# Patient Record
Sex: Female | Born: 1950 | Race: White | Hispanic: No | Marital: Married | Smoking: Former smoker
Health system: Southern US, Community
[De-identification: ages and names within clinical notes are randomized; demographics above are authoritative.]

## PROBLEM LIST (undated history)

## (undated) DIAGNOSIS — E785 Hyperlipidemia, unspecified: Secondary | ICD-10-CM

## (undated) DIAGNOSIS — C679 Malignant neoplasm of bladder, unspecified: Secondary | ICD-10-CM

## (undated) DIAGNOSIS — T7840XA Allergy, unspecified, initial encounter: Secondary | ICD-10-CM

## (undated) DIAGNOSIS — Z973 Presence of spectacles and contact lenses: Secondary | ICD-10-CM

## (undated) HISTORY — PX: TUBAL LIGATION: SHX77

## (undated) HISTORY — DX: Allergy, unspecified, initial encounter: T78.40XA

## (undated) HISTORY — PX: TONSILLECTOMY: SUR1361

---

## 2004-03-15 HISTORY — PX: COLONOSCOPY: SHX174

## 2005-12-13 ENCOUNTER — Other Ambulatory Visit: Admission: RE | Admit: 2005-12-13 | Discharge: 2005-12-13 | Payer: Self-pay | Admitting: Obstetrics and Gynecology

## 2006-07-08 ENCOUNTER — Encounter: Admission: RE | Admit: 2006-07-08 | Discharge: 2006-07-08 | Payer: Self-pay | Admitting: Internal Medicine

## 2007-09-14 ENCOUNTER — Encounter: Admission: RE | Admit: 2007-09-14 | Discharge: 2007-09-14 | Payer: Self-pay | Admitting: Obstetrics and Gynecology

## 2008-11-25 ENCOUNTER — Encounter: Admission: RE | Admit: 2008-11-25 | Discharge: 2008-11-25 | Payer: Self-pay | Admitting: Obstetrics and Gynecology

## 2009-03-04 ENCOUNTER — Emergency Department (HOSPITAL_COMMUNITY): Admission: EM | Admit: 2009-03-04 | Discharge: 2009-03-04 | Payer: Self-pay | Admitting: Family Medicine

## 2009-12-08 ENCOUNTER — Encounter: Admission: RE | Admit: 2009-12-08 | Discharge: 2009-12-08 | Payer: Self-pay | Admitting: Obstetrics and Gynecology

## 2010-11-06 ENCOUNTER — Other Ambulatory Visit: Payer: Self-pay | Admitting: Obstetrics and Gynecology

## 2010-11-06 DIAGNOSIS — Z1231 Encounter for screening mammogram for malignant neoplasm of breast: Secondary | ICD-10-CM

## 2010-12-11 ENCOUNTER — Ambulatory Visit
Admission: RE | Admit: 2010-12-11 | Discharge: 2010-12-11 | Disposition: A | Discharge status: Home / Self Care | Payer: BC Managed Care – PPO | Source: Ambulatory Visit | Attending: Obstetrics and Gynecology | Admitting: Obstetrics and Gynecology

## 2010-12-11 DIAGNOSIS — Z1231 Encounter for screening mammogram for malignant neoplasm of breast: Secondary | ICD-10-CM

## 2011-02-09 ENCOUNTER — Other Ambulatory Visit: Payer: Self-pay | Admitting: Orthopedic Surgery

## 2011-02-09 DIAGNOSIS — R531 Weakness: Secondary | ICD-10-CM

## 2011-02-09 DIAGNOSIS — M25512 Pain in left shoulder: Secondary | ICD-10-CM

## 2011-02-11 ENCOUNTER — Ambulatory Visit
Admission: RE | Admit: 2011-02-11 | Discharge: 2011-02-11 | Disposition: A | Discharge status: Home / Self Care | Payer: BC Managed Care – PPO | Source: Ambulatory Visit | Attending: Orthopedic Surgery | Admitting: Orthopedic Surgery

## 2011-02-11 DIAGNOSIS — R531 Weakness: Secondary | ICD-10-CM

## 2011-02-11 DIAGNOSIS — M25512 Pain in left shoulder: Secondary | ICD-10-CM

## 2011-04-21 ENCOUNTER — Other Ambulatory Visit: Payer: Self-pay | Admitting: Orthopedic Surgery

## 2011-04-21 ENCOUNTER — Ambulatory Visit
Admission: RE | Admit: 2011-04-21 | Discharge: 2011-04-21 | Disposition: A | Discharge status: Home / Self Care | Payer: BC Managed Care – PPO | Source: Ambulatory Visit | Attending: Orthopedic Surgery | Admitting: Orthopedic Surgery

## 2011-04-21 DIAGNOSIS — M25519 Pain in unspecified shoulder: Secondary | ICD-10-CM

## 2011-12-29 ENCOUNTER — Other Ambulatory Visit: Payer: Self-pay | Admitting: Obstetrics and Gynecology

## 2011-12-29 DIAGNOSIS — Z1231 Encounter for screening mammogram for malignant neoplasm of breast: Secondary | ICD-10-CM

## 2011-12-30 ENCOUNTER — Ambulatory Visit
Admission: RE | Admit: 2011-12-30 | Discharge: 2011-12-30 | Disposition: A | Discharge status: Home / Self Care | Payer: BC Managed Care – PPO | Source: Ambulatory Visit | Attending: Obstetrics and Gynecology | Admitting: Obstetrics and Gynecology

## 2011-12-30 DIAGNOSIS — Z1231 Encounter for screening mammogram for malignant neoplasm of breast: Secondary | ICD-10-CM

## 2013-01-08 ENCOUNTER — Other Ambulatory Visit: Payer: Self-pay | Admitting: Urology

## 2013-01-10 ENCOUNTER — Encounter (HOSPITAL_BASED_OUTPATIENT_CLINIC_OR_DEPARTMENT_OTHER): Payer: Self-pay | Admitting: *Deleted

## 2013-01-11 ENCOUNTER — Encounter (HOSPITAL_BASED_OUTPATIENT_CLINIC_OR_DEPARTMENT_OTHER): Payer: Self-pay | Admitting: *Deleted

## 2013-01-11 NOTE — Progress Notes (Signed)
NPO AFTER MN WITH EXCEPTION CLEAR LIQUIDS UNTIL 0830 (NO CREAM/ MILK PRODUCTS). ARRIVE AT 1315. NEEDS ISTAT 8.

## 2013-01-17 ENCOUNTER — Ambulatory Visit (HOSPITAL_BASED_OUTPATIENT_CLINIC_OR_DEPARTMENT_OTHER): Payer: PRIVATE HEALTH INSURANCE | Admitting: Anesthesiology

## 2013-01-17 ENCOUNTER — Ambulatory Visit (HOSPITAL_BASED_OUTPATIENT_CLINIC_OR_DEPARTMENT_OTHER)
Admission: RE | Admit: 2013-01-17 | Discharge: 2013-01-17 | Disposition: A | Discharge status: Home / Self Care | Payer: PRIVATE HEALTH INSURANCE | Source: Ambulatory Visit | Attending: Urology | Admitting: Urology

## 2013-01-17 ENCOUNTER — Encounter (HOSPITAL_BASED_OUTPATIENT_CLINIC_OR_DEPARTMENT_OTHER): Payer: Self-pay

## 2013-01-17 ENCOUNTER — Encounter (HOSPITAL_BASED_OUTPATIENT_CLINIC_OR_DEPARTMENT_OTHER): Payer: PRIVATE HEALTH INSURANCE | Admitting: Anesthesiology

## 2013-01-17 ENCOUNTER — Encounter (HOSPITAL_BASED_OUTPATIENT_CLINIC_OR_DEPARTMENT_OTHER)
Admission: RE | Disposition: A | Discharge status: Home / Self Care | Payer: Self-pay | Source: Ambulatory Visit | Attending: Urology

## 2013-01-17 ENCOUNTER — Ambulatory Visit (HOSPITAL_COMMUNITY): Payer: PRIVATE HEALTH INSURANCE

## 2013-01-17 DIAGNOSIS — E785 Hyperlipidemia, unspecified: Secondary | ICD-10-CM | POA: Insufficient documentation

## 2013-01-17 DIAGNOSIS — C679 Malignant neoplasm of bladder, unspecified: Secondary | ICD-10-CM | POA: Insufficient documentation

## 2013-01-17 DIAGNOSIS — Z87891 Personal history of nicotine dependence: Secondary | ICD-10-CM | POA: Insufficient documentation

## 2013-01-17 HISTORY — DX: Malignant neoplasm of bladder, unspecified: C67.9

## 2013-01-17 HISTORY — DX: Hyperlipidemia, unspecified: E78.5

## 2013-01-17 HISTORY — DX: Presence of spectacles and contact lenses: Z97.3

## 2013-01-17 HISTORY — PX: CYSTOSCOPY W/ URETERAL STENT PLACEMENT: SHX1429

## 2013-01-17 HISTORY — PX: TRANSURETHRAL RESECTION OF BLADDER TUMOR: SHX2575

## 2013-01-17 LAB — POCT I-STAT, CHEM 8
BUN: 12 mg/dL (ref 6–23)
Calcium, Ion: 1.17 mmol/L (ref 1.13–1.30)
Glucose, Bld: 91 mg/dL (ref 70–99)
HCT: 45 % (ref 36.0–46.0)
Hemoglobin: 15.3 g/dL — ABNORMAL HIGH (ref 12.0–15.0)
Potassium: 3.4 mEq/L — ABNORMAL LOW (ref 3.5–5.1)
Sodium: 143 mEq/L (ref 135–145)
TCO2: 22 mmol/L (ref 0–100)

## 2013-01-17 SURGERY — TURBT (TRANSURETHRAL RESECTION OF BLADDER TUMOR)
Anesthesia: General | Site: Ureter | Wound class: Clean Contaminated

## 2013-01-17 MED ORDER — OXYBUTYNIN CHLORIDE 5 MG PO TABS
5.0000 mg | ORAL_TABLET | Freq: Three times a day (TID) | ORAL | Status: DC | PRN
  Administered 2013-01-17: 5 mg via ORAL
  Filled 2013-01-17: qty 1

## 2013-01-17 MED ORDER — SENNOSIDES-DOCUSATE SODIUM 8.6-50 MG PO TABS: 1.0000 | ORAL_TABLET | Freq: Two times a day (BID) | ORAL | Status: DC

## 2013-01-17 MED ORDER — ONDANSETRON HCL 4 MG/2ML IJ SOLN
INTRAMUSCULAR | Status: DC | PRN
  Administered 2013-01-17: 4 mg via INTRAVENOUS

## 2013-01-17 MED ORDER — IOHEXOL 350 MG/ML SOLN
INTRAVENOUS | Status: DC | PRN
  Administered 2013-01-17: 10 mL

## 2013-01-17 MED ORDER — OXYCODONE HCL 5 MG/5ML PO SOLN
5.0000 mg | Freq: Once | ORAL | Status: AC | PRN
  Filled 2013-01-17: qty 5

## 2013-01-17 MED ORDER — MIDAZOLAM HCL 5 MG/5ML IJ SOLN
INTRAMUSCULAR | Status: DC | PRN
  Administered 2013-01-17: 2 mg via INTRAVENOUS

## 2013-01-17 MED ORDER — PROPOFOL 10 MG/ML IV BOLUS
INTRAVENOUS | Status: DC | PRN
  Administered 2013-01-17: 200 mg via INTRAVENOUS

## 2013-01-17 MED ORDER — OXYCODONE-ACETAMINOPHEN 5-325 MG PO TABS: 1.0000 | ORAL_TABLET | Freq: Four times a day (QID) | ORAL | Status: DC | PRN

## 2013-01-17 MED ORDER — GENTAMICIN IN SALINE 1.6-0.9 MG/ML-% IV SOLN
80.0000 mg | INTRAVENOUS | Status: DC
  Filled 2013-01-17: qty 50

## 2013-01-17 MED ORDER — SULFAMETHOXAZOLE-TMP DS 800-160 MG PO TABS: 1.0000 | ORAL_TABLET | Freq: Two times a day (BID) | ORAL | Status: DC

## 2013-01-17 MED ORDER — HYDROMORPHONE HCL PF 1 MG/ML IJ SOLN
0.2500 mg | INTRAMUSCULAR | Status: DC | PRN
  Administered 2013-01-17 (×2): 0.25 mg via INTRAVENOUS
  Filled 2013-01-17: qty 1

## 2013-01-17 MED ORDER — LIDOCAINE HCL (CARDIAC) 20 MG/ML IV SOLN
INTRAVENOUS | Status: DC | PRN
  Administered 2013-01-17: 100 mg via INTRAVENOUS

## 2013-01-17 MED ORDER — DEXAMETHASONE SODIUM PHOSPHATE 4 MG/ML IJ SOLN
INTRAMUSCULAR | Status: DC | PRN
  Administered 2013-01-17: 10 mg via INTRAVENOUS

## 2013-01-17 MED ORDER — PROMETHAZINE HCL 25 MG/ML IJ SOLN
6.2500 mg | INTRAMUSCULAR | Status: DC | PRN
  Filled 2013-01-17: qty 1

## 2013-01-17 MED ORDER — SUCCINYLCHOLINE CHLORIDE 20 MG/ML IJ SOLN
INTRAMUSCULAR | Status: DC | PRN
  Administered 2013-01-17: 80 mg via INTRAVENOUS

## 2013-01-17 MED ORDER — MEPERIDINE HCL 25 MG/ML IJ SOLN
6.2500 mg | INTRAMUSCULAR | Status: DC | PRN
  Filled 2013-01-17: qty 1

## 2013-01-17 MED ORDER — SODIUM CHLORIDE 0.9 % IR SOLN
Status: DC | PRN
  Administered 2013-01-17: 6000 mL

## 2013-01-17 MED ORDER — OXYCODONE HCL 5 MG PO TABS
5.0000 mg | ORAL_TABLET | Freq: Once | ORAL | Status: AC | PRN
  Administered 2013-01-17: 5 mg via ORAL
  Filled 2013-01-17: qty 1

## 2013-01-17 MED ORDER — FENTANYL CITRATE 0.05 MG/ML IJ SOLN
INTRAMUSCULAR | Status: DC | PRN
  Administered 2013-01-17 (×2): 50 ug via INTRAVENOUS

## 2013-01-17 MED ORDER — PROMETHAZINE HCL 25 MG/ML IJ SOLN
12.5000 mg | Freq: Once | INTRAMUSCULAR | Status: AC
  Administered 2013-01-17: 12.5 mg via INTRAMUSCULAR
  Filled 2013-01-17: qty 1

## 2013-01-17 MED ORDER — LACTATED RINGERS IV SOLN
INTRAVENOUS | Status: DC
  Administered 2013-01-17: 14:00:00 via INTRAVENOUS
  Filled 2013-01-17: qty 1000

## 2013-01-17 MED ORDER — GENTAMICIN SULFATE 40 MG/ML IJ SOLN
5.0000 mg/kg | Freq: Once | INTRAVENOUS | Status: AC
  Administered 2013-01-17: 380 mg via INTRAVENOUS

## 2013-01-17 MED ORDER — OXYBUTYNIN CHLORIDE 5 MG PO TABS: 5.0000 mg | ORAL_TABLET | Freq: Three times a day (TID) | ORAL | Status: DC | PRN

## 2013-01-17 MED ORDER — ACETAMINOPHEN 10 MG/ML IV SOLN
INTRAVENOUS | Status: DC | PRN
  Administered 2013-01-17: 1000 mg via INTRAVENOUS

## 2013-01-17 SURGICAL SUPPLY — 37 items
BAG DRN ANRFLXCHMBR STRAP LEK (BAG)
BAG URINE DRAINAGE (UROLOGICAL SUPPLIES) IMPLANT
BAG URINE LEG 19OZ MD ST LTX (BAG) IMPLANT
BAG URINE LEG 500ML (DRAIN) IMPLANT
BAG URO CATCHER STRL LF (DRAPE) ×3 IMPLANT
BASKET LASER NITINOL 1.9FR (BASKET) IMPLANT
BASKET ZERO TIP NITINOL 2.4FR (BASKET) IMPLANT
BSKT STON RTRVL 120 1.9FR (BASKET)
BSKT STON RTRVL ZERO TP 2.4FR (BASKET)
CANISTER SUCT LVC 12 LTR MEDI- (MISCELLANEOUS) ×2 IMPLANT
CATH FOLEY 2WAY SLVR  5CC 22FR (CATHETERS)
CATH FOLEY 2WAY SLVR 30CC 20FR (CATHETERS) IMPLANT
CATH FOLEY 2WAY SLVR 5CC 22FR (CATHETERS) IMPLANT
CATH INTERMIT  6FR 70CM (CATHETERS) ×3 IMPLANT
CLOTH BEACON ORANGE TIMEOUT ST (SAFETY) ×3 IMPLANT
DRAPE CAMERA CLOSED 9X96 (DRAPES) ×3 IMPLANT
ELECT REM PT RETURN 9FT ADLT (ELECTROSURGICAL)
ELECTRODE REM PT RTRN 9FT ADLT (ELECTROSURGICAL) ×2 IMPLANT
EVACUATOR MICROVAS BLADDER (UROLOGICAL SUPPLIES) IMPLANT
GLOVE BIO SURGEON STRL SZ7.5 (GLOVE) ×3 IMPLANT
GLOVE BIOGEL M STER SZ 6 (GLOVE) ×1 IMPLANT
GLOVE BIOGEL PI IND STRL 6.5 (GLOVE) IMPLANT
GLOVE BIOGEL PI INDICATOR 6.5 (GLOVE) ×2
GOWN PREVENTION PLUS XLARGE (GOWN DISPOSABLE) ×3 IMPLANT
GOWN STRL NON-REIN LRG LVL3 (GOWN DISPOSABLE) ×3 IMPLANT
GUIDEWIRE ANG ZIPWIRE 038X150 (WIRE) ×3 IMPLANT
GUIDEWIRE STR DUAL SENSOR (WIRE) ×3 IMPLANT
IV NS IRRIG 3000ML ARTHROMATIC (IV SOLUTION) ×4 IMPLANT
KIT ASPIRATION TUBING (SET/KITS/TRAYS/PACK) IMPLANT
LOOP CUTTING 24FR OLYMPUS (CUTTING LOOP) IMPLANT
LOOP CUTTING 26FR OLYMPUS (CUTTING LOOP) IMPLANT
PACK CYSTOSCOPY (CUSTOM PROCEDURE TRAY) ×3 IMPLANT
SET ASPIRATION TUBING (TUBING) IMPLANT
STENT URET 6FRX24 CONTOUR (STENTS) ×1 IMPLANT
SYRINGE 10CC LL (SYRINGE) ×3 IMPLANT
SYRINGE IRR TOOMEY STRL 70CC (SYRINGE) IMPLANT
TUBE FEEDING 8FR 16IN STR KANG (MISCELLANEOUS) IMPLANT

## 2013-01-17 NOTE — Progress Notes (Signed)
Paged on call physician for urology Dr. Delorse Lek vomited 100 ml emesis after IV removed, see orders and will call in prescription to [facility].

## 2013-01-17 NOTE — Brief Op Note (Signed)
01/17/2013  3:55 PM  PATIENT:  Ronna Polio  62 y.o. female  PRE-OPERATIVE DIAGNOSIS:  BLADDER CANCER  POST-OPERATIVE DIAGNOSIS:  BLADDER CANCER  PROCEDURE:  Procedure(s): TRANSURETHRAL RESECTION OF BLADDER TUMOR (TURBT) (N/A) CYSTOSCOPY WITH BILATERAL RETROGRADE PYELOGRAM/ LEFT URETERAL STENT PLACEMENT (Bilateral)  SURGEON:  Surgeon(s) and Role:    * Sebastian Ache, MD - Primary  PHYSICIAN ASSISTANT:   ASSISTANTS: none   ANESTHESIA:   general  EBL:  Total I/O In: 200 [I.V.:200] Out: -   BLOOD ADMINISTERED:none  DRAINS: none   LOCAL MEDICATIONS USED:  NONE  SPECIMEN:  Source of Specimen:  1 - Bladder Tumor, 2- Base of Bladder Tumor, 3- Deepest margin of Bladder tumor  DISPOSITION OF SPECIMEN:  PATHOLOGY  COUNTS:  YES  TOURNIQUET:  * No tourniquets in log *  DICTATION: .Other Dictation: Dictation Number [id number]  PLAN OF CARE: Discharge to home after PACU  PATIENT DISPOSITION:  PACU - hemodynamically stable.   Delay start of Pharmacological VTE agent (>24hrs) due to surgical blood loss or risk of bleeding: not applicable

## 2013-01-17 NOTE — H&P (Signed)
Lindsay Burch is an 62 y.o. female.    Chief Complaint: Pre-op Transurethral Resection of BLadder Tumor  HPI:   1 - Gross Hematuria - Pt with new gross hematuria 20/2014. CT Urogram with left UVJ bladder mass c/w bladder cancer. Remote 15PY smoker. Cysto 12/2012 with golf-ball sized papillary tumor very near left ureteral orifice.  2 - Bladder Cancer - New bladder cancer by CT and cysto 2014. 3-4cm papillary at left UO. No distant or locally advanced disease by imaging.  PMH sig for CS x3. No CV disease. No strong blood thinners.   Today Lindsay Burch is seen to proceed with transurethral resection of bladder tumor for diagnostic, treatemnt, and staging purposese. Most recent UA withotu significant infectiuos parameters. No interval fevers.   Past Medical History  Diagnosis Date  . Bladder cancer   . Hyperlipidemia   . Wears glasses     Past Surgical History  Procedure Laterality Date  . Cesarean section  X3  LAST ONE 1976    LAST ONE W/ TUBAL LIGATION  . Tonsillectomy  AGE 1'S    History reviewed. No pertinent family history. Social History:  reports that she quit smoking about 20 years ago. Her smoking use included Cigarettes. She has a 16 pack-year smoking history. She has never used smokeless tobacco. She reports that she drinks alcohol. She reports that she does not use illicit drugs.  Allergies:  Allergies  Allergen Reactions  . Codeine Nausea And Vomiting    No prescriptions prior to admission    No results found for this or any previous visit (from the past 48 hour(s)). No results found.  Review of Systems  Constitutional: Negative for fever and chills.  HENT: Negative.   Eyes: Negative.   Respiratory: Negative.   Cardiovascular: Negative.   Gastrointestinal: Negative.   Genitourinary: Positive for hematuria. Negative for flank pain.  Musculoskeletal: Negative.   Skin: Negative.   Neurological: Negative.   Endo/Heme/Allergies: Negative.    Psychiatric/Behavioral: Negative.     Height 5' 4.5" (1.638 m), weight 76.204 kg (168 lb). Physical Exam  Constitutional: She is oriented to person, place, and time. She appears well-developed and well-nourished.  HENT:  Head: Normocephalic and atraumatic.  Eyes: EOM are normal. Pupils are equal, round, and reactive to light.  Neck: Normal range of motion. Neck supple.  Cardiovascular: Normal rate and regular rhythm.   Respiratory: Effort normal.  GI: Soft.  Genitourinary:  No CVAT  Musculoskeletal: Normal range of motion.  Neurological: She is alert and oriented to person, place, and time.  Skin: Skin is warm and dry.  Psychiatric: She has a normal mood and affect. Her behavior is normal. Judgment and thought content normal.     Assessment/Plan  1 - Bladder Cancer / Gross Hematuria -  We discussed operative biopsy / transurethral resection as the best next step for diagnostic and therapeutic purposes with goals being to remove all visible cancer and obtain tissue for pathologic exam. We discussed that for some low-grade tumors, this may be all the treatment required, but that for many other tumors such as high-grade lesions, further therapy including surgery and or chemotherapy may be warranted. We also outlined the fact that any bladder cancer diagnosis will require close follow-up with periodic upper and lower tract evaluation. We discussed risks including bleeding, infection, damage to kidney / ureter / bladder including bladder perforation which can typically managed with prolonged foley catheterization. We mentioned anesthetic and other rare risks including DVT, PE, MI, and mortality.  I also mentioned that adjunctive procedures such as ureteral stenting, retrograde pyelography, and ureteroscopy may be necessary to fully evaluate the urinary tract depending on intra-operative findings. After answering all questions to the patient's satisfaction, they wish to proceed.   WE also  specifically adressed that since lesions close to left UO, she may require peri-operative ureteral stenting.  Lindsay Burch 01/17/2013, 7:40 AM

## 2013-01-17 NOTE — Transfer of Care (Signed)
Immediate Anesthesia Transfer of Care Note  Patient: Lindsay Burch  Procedure(s) Performed: Procedure(s): TRANSURETHRAL RESECTION OF BLADDER TUMOR (TURBT) (N/A) CYSTOSCOPY WITH BILATERAL RETROGRADE PYELOGRAM/ LEFT URETERAL STENT PLACEMENT (Bilateral)  Patient Location: PACU  Anesthesia Type:General  Level of Consciousness: awake and oriented  Airway & Oxygen Therapy: Patient Spontanous Breathing and Patient connected to nasal cannula oxygen  Post-op Assessment: Report given to PACU RN  Post vital signs: Reviewed and stable  Complications: No apparent anesthesia complications

## 2013-01-17 NOTE — Anesthesia Procedure Notes (Signed)
Procedure Name: LMA Insertion Date/Time: 01/17/2013 3:26 PM Performed by: Maris Berger T Pre-anesthesia Checklist: Patient identified, Emergency Drugs available, Suction available and Patient being monitored Patient Re-evaluated:Patient Re-evaluated prior to inductionOxygen Delivery Method: Circle System Utilized Preoxygenation: Pre-oxygenation with 100% oxygen Intubation Type: IV induction Ventilation: Mask ventilation without difficulty LMA: LMA inserted LMA Size: 4.0 Number of attempts: 1 Airway Equipment and Method: bite block Placement Confirmation: positive ETCO2 Dental Injury: Teeth and Oropharynx as per pre-operative assessment

## 2013-01-17 NOTE — Anesthesia Preprocedure Evaluation (Addendum)
Anesthesia Evaluation  Patient identified by MRN, date of birth, ID band Patient awake    Reviewed: Allergy & Precautions, H&P , NPO status , Patient's Chart, lab work & pertinent test results  Airway Mallampati: II TM Distance: >3 FB Neck ROM: Full    Dental  (+) Dental Advisory Given   Pulmonary former smoker,  breath sounds clear to auscultation        Cardiovascular negative cardio ROS  Rhythm:Regular Rate:Normal     Neuro/Psych negative neurological ROS  negative psych ROS   GI/Hepatic negative GI ROS, Neg liver ROS,   Endo/Other  negative endocrine ROS  Renal/GU negative Renal ROS     Musculoskeletal negative musculoskeletal ROS (+)   Abdominal   Peds  Hematology negative hematology ROS (+)   Anesthesia Other Findings   Reproductive/Obstetrics                           Anesthesia Physical Anesthesia Plan  ASA: II  Anesthesia Plan: General   Post-op Pain Management:    Induction: Intravenous  Airway Management Planned: LMA  Additional Equipment:   Intra-op Plan:   Post-operative Plan: Extubation in OR  Informed Consent: I have reviewed the patients History and Physical, chart, labs and discussed the procedure including the risks, benefits and alternatives for the proposed anesthesia with the patient or authorized representative who has indicated his/her understanding and acceptance.   Dental advisory given  Plan Discussed with: CRNA  Anesthesia Plan Comments:         Anesthesia Quick Evaluation  

## 2013-01-18 ENCOUNTER — Encounter (HOSPITAL_BASED_OUTPATIENT_CLINIC_OR_DEPARTMENT_OTHER): Payer: Self-pay | Admitting: Urology

## 2013-01-18 NOTE — Anesthesia Postprocedure Evaluation (Signed)
Anesthesia Post Note  Patient: Lindsay Burch  Procedure(s) Performed: Procedure(s) (LRB): TRANSURETHRAL RESECTION OF BLADDER TUMOR (TURBT) (N/A) CYSTOSCOPY WITH BILATERAL RETROGRADE PYELOGRAM/ LEFT URETERAL STENT PLACEMENT (Bilateral)  Anesthesia type: General  Patient location: PACU  Post pain: Pain level controlled  Post assessment: Post-op Vital signs reviewed  Last Vitals: BP 135/71  Pulse 75  Temp(Src) 36.1 C (Oral)  Resp 18  Ht 5' 4.5" (1.638 m)  Wt 168 lb 8 oz (76.431 kg)  BMI 28.49 kg/m2  SpO2 97%  Post vital signs: Reviewed  Level of consciousness: sedated  Complications: No apparent anesthesia complications

## 2013-01-19 NOTE — Op Note (Signed)
NAME:  Lindsay Burch, Lindsay Burch NO.:  [id number]  MEDICAL RECORD NO.:  [id number]  LOCATION:                                 FACILITY:  PHYSICIAN:  Sebastian Ache, MD     DATE OF BIRTH:  [birth year]  DATE OF PROCEDURE: 01/17/2013 DATE OF DISCHARGE:  01/17/2013                              OPERATIVE REPORT   DIAGNOSIS:  Bladder cancer.  PROCEDURE: 1. Transurethral bladder tumor volume medium. 2. Bilateral pyelogram interpretation. 3. Insertion of left ureteral stent 6 x 24 with tether.  FINDINGS: 1. Papillary bladder tumor approximately 3 cm diameter just in the     lateral to the left ureteral orifice. 2. Unremarkable lateral pyelograms.  ESTIMATED BLOOD LOSS:  Nil.  SPECIMEN: 1. Bladder tumor . 2. Base of bladder tumor. 3. Deepest margin, bladder tumor.  INDICATION:  Lindsay Burch is a very pleasant 62 year old lady who was found on workup gross hematuria to have a papillary appearing bladder mass.  CT urogram revealed no distant disease.  This was worrisome for localized bladder cancer.  Options were discussed including transurethral resection for diagnostic and staging purposes and wished to proceed.  Informed consent was obtained and placed in medical record. Given the location of the tumor very near her left ureteral orifice, we had discussed preoperatively possibility of left ureteral stenting for protection of her left UO while healing.  Informed consent was obtained and placed in medical record.  PROCEDURE IN DETAIL:  The patient being Lindsay Burch, procedure being transection of bladder tumor, bilateral retrograde pyelograms and stent was confirmed.  Procedure was carried out.  Time-out was performed. Intravenous antibiotics administered.  General LMA anesthesia was introduced.  The patient placed into a low lithotomy position.  Sterile field was created by prepping and draping the patient's vagina, introitus, and proximal thighs using iodine  x3.  Cystourethroscopy performed using a 22-French rigid cystoscope with 12- degree offset lens.  Inspection of bladder revealed approximately 3 cm golf ball-sized papillary tumor just inferior and lateral to the left ureteral orifice.  There were no additional papillary lesions or patchy erythema.  Ureteral orifices in the normal anatomic location.  Attention was directed bilateral pyelogram 1st on the right side.  The right ureteral orifice was cannulated with a 6-French end-hole catheter and right retrograde pyelogram seen.  Right retrograde pyelogram demonstrated a single right ureter, single system, right kidney.  No filling defects or narrowing noted. Similarly, left retrograde pyelogram seen.  Left retrograde pyelogram demonstrated a single left ureter, single system left kidney.  No filling defects or narrowing noted.  The cystoscope was exchanged for a 26-French ACMI continuous flow resectoscope using medium-sized loop.  Very careful resection was performed of the tumor down what appeared to be flushed with the bladder mucosa.  Bladder tumor fragments were set aside, labeled bladder tumor.  Additional swipes were taken of the muscular layer with the loop.  This was set aside, labeled base of bladder tumor.  Several additional cold cup biopsy were obtained from the deepest aspect labeled deepest bladder margin.  These were also aside for permanent pathology.  Repeat inspection following these maneuvers revealed excellent hemostasis.  No evidence of bladder  perforation.  The edges of the resection area were fulgurated taking great care not to fulgurate within 1 cm of the ureteral orifice.  This did not occur.  Given the orientation of bladder tumor, and resection site, it was felt that left ureteral stenting preoperatively would be warranted.  As such, a 0.038 Sensor wire was advanced at the level of the upper pole of left kidney, over which a new 6 x 24 double-J stent was  placed using cystoscopic and fluoroscopic guidance.  Good proximal and distal curl were noted.  Tether was left in place and fashioned to the size.  Bladder was emptied per cystoscope. Procedure was then terminated.  The patient tolerated the procedure well.  There were no immediate periprocedural complications.  The patient was taken to the postanesthesia care unit in stable condition.          ______________________________ Sebastian Ache, MD     TM/MEDQ  D:  01/17/2013  T:  01/18/2013  Job:  [id number]

## 2013-12-26 ENCOUNTER — Other Ambulatory Visit: Payer: Self-pay | Admitting: Internal Medicine

## 2013-12-26 DIAGNOSIS — R590 Localized enlarged lymph nodes: Secondary | ICD-10-CM

## 2013-12-28 ENCOUNTER — Ambulatory Visit
Admission: RE | Admit: 2013-12-28 | Discharge: 2013-12-28 | Disposition: A | Discharge status: Home / Self Care | Payer: PRIVATE HEALTH INSURANCE | Source: Ambulatory Visit | Attending: Internal Medicine | Admitting: Internal Medicine

## 2013-12-28 DIAGNOSIS — R590 Localized enlarged lymph nodes: Secondary | ICD-10-CM

## 2013-12-28 MED ORDER — IOHEXOL 300 MG/ML  SOLN
75.0000 mL | Freq: Once | INTRAMUSCULAR | Status: AC | PRN
  Administered 2013-12-28: 75 mL via INTRAVENOUS

## 2014-08-22 DIAGNOSIS — J301 Allergic rhinitis due to pollen: Secondary | ICD-10-CM | POA: Diagnosis not present

## 2014-08-22 DIAGNOSIS — J029 Acute pharyngitis, unspecified: Secondary | ICD-10-CM | POA: Diagnosis not present

## 2014-08-29 DIAGNOSIS — R310 Gross hematuria: Secondary | ICD-10-CM | POA: Diagnosis not present

## 2014-08-29 DIAGNOSIS — C672 Malignant neoplasm of lateral wall of bladder: Secondary | ICD-10-CM | POA: Diagnosis not present

## 2014-09-09 DIAGNOSIS — Z124 Encounter for screening for malignant neoplasm of cervix: Secondary | ICD-10-CM | POA: Diagnosis not present

## 2014-09-09 DIAGNOSIS — D494 Neoplasm of unspecified behavior of bladder: Secondary | ICD-10-CM | POA: Diagnosis not present

## 2014-09-09 DIAGNOSIS — N951 Menopausal and female climacteric states: Secondary | ICD-10-CM | POA: Diagnosis not present

## 2014-09-09 DIAGNOSIS — Z01419 Encounter for gynecological examination (general) (routine) without abnormal findings: Secondary | ICD-10-CM | POA: Diagnosis not present

## 2014-09-09 DIAGNOSIS — Z6832 Body mass index (BMI) 32.0-32.9, adult: Secondary | ICD-10-CM | POA: Diagnosis not present

## 2014-09-09 DIAGNOSIS — Z1231 Encounter for screening mammogram for malignant neoplasm of breast: Secondary | ICD-10-CM | POA: Diagnosis not present

## 2014-09-09 DIAGNOSIS — Z1151 Encounter for screening for human papillomavirus (HPV): Secondary | ICD-10-CM | POA: Diagnosis not present

## 2014-09-17 DIAGNOSIS — Z6834 Body mass index (BMI) 34.0-34.9, adult: Secondary | ICD-10-CM | POA: Diagnosis not present

## 2014-09-17 DIAGNOSIS — H6590 Unspecified nonsuppurative otitis media, unspecified ear: Secondary | ICD-10-CM | POA: Diagnosis not present

## 2014-10-01 DIAGNOSIS — R310 Gross hematuria: Secondary | ICD-10-CM | POA: Diagnosis not present

## 2014-10-01 DIAGNOSIS — N390 Urinary tract infection, site not specified: Secondary | ICD-10-CM | POA: Diagnosis not present

## 2014-12-13 DIAGNOSIS — R7301 Impaired fasting glucose: Secondary | ICD-10-CM | POA: Diagnosis not present

## 2014-12-13 DIAGNOSIS — E785 Hyperlipidemia, unspecified: Secondary | ICD-10-CM | POA: Diagnosis not present

## 2014-12-13 DIAGNOSIS — E559 Vitamin D deficiency, unspecified: Secondary | ICD-10-CM | POA: Diagnosis not present

## 2014-12-26 DIAGNOSIS — E559 Vitamin D deficiency, unspecified: Secondary | ICD-10-CM | POA: Diagnosis not present

## 2014-12-26 DIAGNOSIS — E669 Obesity, unspecified: Secondary | ICD-10-CM | POA: Diagnosis not present

## 2014-12-26 DIAGNOSIS — E785 Hyperlipidemia, unspecified: Secondary | ICD-10-CM | POA: Diagnosis not present

## 2014-12-26 DIAGNOSIS — Z23 Encounter for immunization: Secondary | ICD-10-CM | POA: Diagnosis not present

## 2014-12-26 DIAGNOSIS — M8580 Other specified disorders of bone density and structure, unspecified site: Secondary | ICD-10-CM | POA: Diagnosis not present

## 2014-12-26 DIAGNOSIS — M79671 Pain in right foot: Secondary | ICD-10-CM | POA: Diagnosis not present

## 2014-12-26 DIAGNOSIS — Z8551 Personal history of malignant neoplasm of bladder: Secondary | ICD-10-CM | POA: Diagnosis not present

## 2014-12-26 DIAGNOSIS — Z6835 Body mass index (BMI) 35.0-35.9, adult: Secondary | ICD-10-CM | POA: Diagnosis not present

## 2014-12-26 DIAGNOSIS — Z1389 Encounter for screening for other disorder: Secondary | ICD-10-CM | POA: Diagnosis not present

## 2014-12-26 DIAGNOSIS — R7301 Impaired fasting glucose: Secondary | ICD-10-CM | POA: Diagnosis not present

## 2014-12-26 DIAGNOSIS — J984 Other disorders of lung: Secondary | ICD-10-CM | POA: Diagnosis not present

## 2014-12-26 DIAGNOSIS — K219 Gastro-esophageal reflux disease without esophagitis: Secondary | ICD-10-CM | POA: Diagnosis not present

## 2014-12-30 ENCOUNTER — Other Ambulatory Visit: Payer: Self-pay | Admitting: Internal Medicine

## 2014-12-30 DIAGNOSIS — R911 Solitary pulmonary nodule: Secondary | ICD-10-CM

## 2015-01-01 DIAGNOSIS — Z1212 Encounter for screening for malignant neoplasm of rectum: Secondary | ICD-10-CM | POA: Diagnosis not present

## 2015-01-02 ENCOUNTER — Other Ambulatory Visit: Payer: PRIVATE HEALTH INSURANCE

## 2015-01-22 ENCOUNTER — Encounter: Payer: Self-pay | Admitting: Internal Medicine

## 2015-01-28 ENCOUNTER — Ambulatory Visit
Admission: RE | Admit: 2015-01-28 | Discharge: 2015-01-28 | Disposition: A | Discharge status: Home / Self Care | Payer: Medicare Other | Source: Ambulatory Visit | Attending: Internal Medicine | Admitting: Internal Medicine

## 2015-01-28 DIAGNOSIS — R918 Other nonspecific abnormal finding of lung field: Secondary | ICD-10-CM | POA: Diagnosis not present

## 2015-01-28 DIAGNOSIS — R911 Solitary pulmonary nodule: Secondary | ICD-10-CM

## 2015-02-11 DIAGNOSIS — Z23 Encounter for immunization: Secondary | ICD-10-CM | POA: Diagnosis not present

## 2015-03-06 DIAGNOSIS — R102 Pelvic and perineal pain: Secondary | ICD-10-CM | POA: Diagnosis not present

## 2015-03-06 DIAGNOSIS — C672 Malignant neoplasm of lateral wall of bladder: Secondary | ICD-10-CM | POA: Diagnosis not present

## 2015-03-19 ENCOUNTER — Ambulatory Visit (AMBULATORY_SURGERY_CENTER): Payer: Self-pay | Admitting: *Deleted

## 2015-03-19 VITALS — Ht 60.5 in | Wt 175.0 lb

## 2015-03-19 DIAGNOSIS — Z1211 Encounter for screening for malignant neoplasm of colon: Secondary | ICD-10-CM

## 2015-03-19 MED ORDER — NA SULFATE-K SULFATE-MG SULF 17.5-3.13-1.6 GM/177ML PO SOLN: 1.0000 | Freq: Once | ORAL | Status: DC

## 2015-03-19 NOTE — Progress Notes (Signed)
No egg or soy allergy known to patient  No issues with past sedation with any surgeries  or procedures, no intubation problems -pt has hx post op N/V with most surgeries  Pt has a script for phentermine but has not started this as of yet. Instructed to not start until after the colon 1-18 No home 02 use per patient   emmi declined

## 2015-04-02 ENCOUNTER — Encounter: Payer: Self-pay | Admitting: Internal Medicine

## 2015-04-02 ENCOUNTER — Ambulatory Visit (AMBULATORY_SURGERY_CENTER): Payer: Medicare Other | Admitting: Internal Medicine

## 2015-04-02 VITALS — BP 161/91 | HR 87 | Temp 97.9°F | Resp 24 | Ht 60.5 in | Wt 175.0 lb

## 2015-04-02 DIAGNOSIS — Z1211 Encounter for screening for malignant neoplasm of colon: Secondary | ICD-10-CM

## 2015-04-02 MED ORDER — SODIUM CHLORIDE 0.9 % IV SOLN: 500.0000 mL | INTRAVENOUS | Status: DC

## 2015-04-02 NOTE — Progress Notes (Signed)
Report to PACU, RN, vss, BBS= Clear.  

## 2015-04-02 NOTE — Progress Notes (Signed)
No problems noted in the recovery room. maw 

## 2015-04-02 NOTE — Patient Instructions (Signed)
YOU HAD AN ENDOSCOPIC PROCEDURE TODAY AT [facility]:   Refer to the procedure report that was given to you for any specific questions about what was found during the examination.  If the procedure report does not answer your questions, please call your gastroenterologist to clarify.  If you requested that your care partner not be given the details of your procedure findings, then the procedure report has been included in a sealed envelope for you to review at your convenience later.  YOU SHOULD EXPECT: Some feelings of bloating in the abdomen. Passage of more gas than usual.  Walking can help get rid of the air that was put into your GI tract during the procedure and reduce the bloating. If you had a lower endoscopy (such as a colonoscopy or flexible sigmoidoscopy) you may notice spotting of blood in your stool or on the toilet paper. If you underwent a bowel prep for your procedure, you may not have a normal bowel movement for a few days.  Please Note:  You might notice some irritation and congestion in your nose or some drainage.  This is from the oxygen used during your procedure.  There is no need for concern and it should clear up in a day or so.  SYMPTOMS TO REPORT IMMEDIATELY:   Following lower endoscopy (colonoscopy or flexible sigmoidoscopy):  Excessive amounts of blood in the stool  Significant tenderness or worsening of abdominal pains  Swelling of the abdomen that is new, acute  Fever of 100F or higher   For urgent or emergent issues, a gastroenterologist can be reached at any hour by calling [phone].   DIET: Your first meal following the procedure should be a small meal and then it is ok to progress to your normal diet. Heavy or fried foods are harder to digest and may make you feel nauseous or bloated.  Likewise, meals heavy in dairy and vegetables can increase bloating.  Drink plenty of fluids but you should avoid alcoholic beverages for 24  hours.  ACTIVITY:  You should plan to take it easy for the rest of today and you should NOT DRIVE or use heavy machinery until tomorrow (because of the sedation medicines used during the test).    FOLLOW UP: Our staff will call the number listed on your records the next business day following your procedure to check on you and address any questions or concerns that you may have regarding the information given to you following your procedure. If we do not reach you, we will leave a message.  However, if you are feeling well and you are not experiencing any problems, there is no need to return our call.  We will assume that you have returned to your regular daily activities without incident.  If any biopsies were taken you will be contacted by phone or by letter within the next 1-3 weeks.  Please call us at [phone] if you have not heard about the biopsies in 3 weeks.    SIGNATURES/CONFIDENTIALITY: You and/or your care partner have signed paperwork which will be entered into your electronic medical record.  These signatures attest to the fact that that the information above on your After Visit Summary has been reviewed and is understood.  Full responsibility of the confidentiality of this discharge information lies with you and/or your care-partner.   Handout was given to your care partner on  Hemorrhoids. You may resume your current medications today. Please call if any questions or  concerns.

## 2015-04-02 NOTE — Op Note (Signed)
[facility]  [address] [place], [address]   COLONOSCOPY PROCEDURE REPORT  PATIENT: Lindsay, Burch  MR#: [id number] BIRTHDATE: April 10, 1950 , 65  yrs. old GENDER: female ENDOSCOPIST: Eustace Quail, MD REFERRED BY:W.  Lutricia Feil, M.D. PROCEDURE DATE:  04/02/2015 PROCEDURE:   Colonoscopy, screening First Screening Colonoscopy - Avg.  risk and is 50 yrs.  old or older - No.  Prior Negative Screening - Now for repeat screening. 10 or more years since last screening  History of Adenoma - Now for follow-up colonoscopy & has been > or = to 3 yrs.  N/A  Polyps removed today? No Recommend repeat exam, <10 yrs? No ASA CLASS:   Class II INDICATIONS:Screening for colonic neoplasia and Colorectal Neoplasm Risk Assessment for this procedure is average risk.. Reports negative index exam 2006 in [place]: Propofol 300 mg IV and Monitored anesthesia care  DESCRIPTION OF PROCEDURE:   After the risks benefits and alternatives of the procedure were thoroughly explained, informed consent was obtained.  The digital rectal exam revealed no abnormalities of the rectum.   The [id number]  endoscope was introduced through the anus and advanced to the cecum, which was identified by both the appendix and ileocecal valve. No adverse events experienced.   The quality of the prep was excellent. (Suprep was used)  The instrument was then slowly withdrawn as the colon was fully examined. Estimated blood loss is zero unless otherwise noted in this procedure report.      COLON FINDINGS: A normal appearing cecum, ileocecal valve, and appendiceal orifice were identified.  The ascending, transverse, descending, sigmoid colon, and rectum appeared unremarkable. Retroflexed views revealed internal hemorrhoids. The time to cecum = 2.0 Withdrawal time = 12.4   The scope was withdrawn and the procedure completed. COMPLICATIONS: There were no immediate  complications.  ENDOSCOPIC IMPRESSION: Normal colonoscopy  RECOMMENDATIONS: Continue current colorectal screening recommendations for "routine risk" patients with a repeat colonoscopy in 10 years.  eSigned:  Eustace Quail, MD 04/02/2015 12:36 PM   cc: The Patient and W.  Lutricia Feil, MD

## 2015-04-03 ENCOUNTER — Telehealth: Payer: Self-pay

## 2015-04-03 NOTE — Telephone Encounter (Signed)
  Follow up Call-  Call back number 04/02/2015  Post procedure Call Back phone  # [phone]  Permission to leave phone message Yes    Patient was called for follow up after procedure on 04/02/2015. I spoke with the patients husband and he relates that the patient is doing well and has returned to her normal daily activities.

## 2015-06-06 DIAGNOSIS — H5340 Unspecified visual field defects: Secondary | ICD-10-CM | POA: Diagnosis not present

## 2015-06-06 DIAGNOSIS — H43822 Vitreomacular adhesion, left eye: Secondary | ICD-10-CM | POA: Diagnosis not present

## 2015-06-06 DIAGNOSIS — H524 Presbyopia: Secondary | ICD-10-CM | POA: Diagnosis not present

## 2015-06-06 DIAGNOSIS — H469 Unspecified optic neuritis: Secondary | ICD-10-CM | POA: Diagnosis not present

## 2015-06-06 DIAGNOSIS — H4720 Unspecified optic atrophy: Secondary | ICD-10-CM | POA: Diagnosis not present

## 2015-06-06 DIAGNOSIS — H35372 Puckering of macula, left eye: Secondary | ICD-10-CM | POA: Diagnosis not present

## 2015-06-06 DIAGNOSIS — H52223 Regular astigmatism, bilateral: Secondary | ICD-10-CM | POA: Diagnosis not present

## 2015-06-06 DIAGNOSIS — H47293 Other optic atrophy, bilateral: Secondary | ICD-10-CM | POA: Diagnosis not present

## 2015-06-06 DIAGNOSIS — H5203 Hypermetropia, bilateral: Secondary | ICD-10-CM | POA: Diagnosis not present

## 2015-08-27 DIAGNOSIS — R310 Gross hematuria: Secondary | ICD-10-CM | POA: Diagnosis not present

## 2015-08-27 DIAGNOSIS — Z8551 Personal history of malignant neoplasm of bladder: Secondary | ICD-10-CM | POA: Diagnosis not present

## 2015-09-03 DIAGNOSIS — R310 Gross hematuria: Secondary | ICD-10-CM | POA: Diagnosis not present

## 2015-09-03 DIAGNOSIS — K449 Diaphragmatic hernia without obstruction or gangrene: Secondary | ICD-10-CM | POA: Diagnosis not present

## 2015-09-24 DIAGNOSIS — Z6832 Body mass index (BMI) 32.0-32.9, adult: Secondary | ICD-10-CM | POA: Diagnosis not present

## 2015-09-24 DIAGNOSIS — N951 Menopausal and female climacteric states: Secondary | ICD-10-CM | POA: Diagnosis not present

## 2015-09-24 DIAGNOSIS — Z01419 Encounter for gynecological examination (general) (routine) without abnormal findings: Secondary | ICD-10-CM | POA: Diagnosis not present

## 2015-09-24 DIAGNOSIS — Z1231 Encounter for screening mammogram for malignant neoplasm of breast: Secondary | ICD-10-CM | POA: Diagnosis not present

## 2015-09-24 DIAGNOSIS — N905 Atrophy of vulva: Secondary | ICD-10-CM | POA: Diagnosis not present

## 2015-09-30 DIAGNOSIS — R310 Gross hematuria: Secondary | ICD-10-CM | POA: Diagnosis not present

## 2015-09-30 DIAGNOSIS — C672 Malignant neoplasm of lateral wall of bladder: Secondary | ICD-10-CM | POA: Diagnosis not present

## 2015-09-30 DIAGNOSIS — R350 Frequency of micturition: Secondary | ICD-10-CM | POA: Diagnosis not present

## 2015-12-04 DIAGNOSIS — H353121 Nonexudative age-related macular degeneration, left eye, early dry stage: Secondary | ICD-10-CM | POA: Diagnosis not present

## 2015-12-04 DIAGNOSIS — H3550 Unspecified hereditary retinal dystrophy: Secondary | ICD-10-CM | POA: Diagnosis not present

## 2015-12-04 DIAGNOSIS — H35373 Puckering of macula, bilateral: Secondary | ICD-10-CM | POA: Diagnosis not present

## 2015-12-04 DIAGNOSIS — Q149 Congenital malformation of posterior segment of eye, unspecified: Secondary | ICD-10-CM | POA: Diagnosis not present

## 2015-12-04 DIAGNOSIS — H43823 Vitreomacular adhesion, bilateral: Secondary | ICD-10-CM | POA: Diagnosis not present

## 2015-12-04 DIAGNOSIS — H353111 Nonexudative age-related macular degeneration, right eye, early dry stage: Secondary | ICD-10-CM | POA: Diagnosis not present

## 2015-12-26 DIAGNOSIS — M859 Disorder of bone density and structure, unspecified: Secondary | ICD-10-CM | POA: Diagnosis not present

## 2015-12-26 DIAGNOSIS — E784 Other hyperlipidemia: Secondary | ICD-10-CM | POA: Diagnosis not present

## 2015-12-26 DIAGNOSIS — R7301 Impaired fasting glucose: Secondary | ICD-10-CM | POA: Diagnosis not present

## 2016-01-08 DIAGNOSIS — Z6836 Body mass index (BMI) 36.0-36.9, adult: Secondary | ICD-10-CM | POA: Diagnosis not present

## 2016-01-08 DIAGNOSIS — Z0000 Encounter for general adult medical examination without abnormal findings: Secondary | ICD-10-CM | POA: Diagnosis not present

## 2016-01-08 DIAGNOSIS — M8580 Other specified disorders of bone density and structure, unspecified site: Secondary | ICD-10-CM | POA: Diagnosis not present

## 2016-01-08 DIAGNOSIS — Z8551 Personal history of malignant neoplasm of bladder: Secondary | ICD-10-CM | POA: Diagnosis not present

## 2016-01-08 DIAGNOSIS — E559 Vitamin D deficiency, unspecified: Secondary | ICD-10-CM | POA: Diagnosis not present

## 2016-01-08 DIAGNOSIS — Z23 Encounter for immunization: Secondary | ICD-10-CM | POA: Diagnosis not present

## 2016-01-08 DIAGNOSIS — M859 Disorder of bone density and structure, unspecified: Secondary | ICD-10-CM | POA: Diagnosis not present

## 2016-01-08 DIAGNOSIS — R7301 Impaired fasting glucose: Secondary | ICD-10-CM | POA: Diagnosis not present

## 2016-01-08 DIAGNOSIS — R030 Elevated blood-pressure reading, without diagnosis of hypertension: Secondary | ICD-10-CM | POA: Diagnosis not present

## 2016-01-08 DIAGNOSIS — F17201 Nicotine dependence, unspecified, in remission: Secondary | ICD-10-CM | POA: Diagnosis not present

## 2016-01-08 DIAGNOSIS — E784 Other hyperlipidemia: Secondary | ICD-10-CM | POA: Diagnosis not present

## 2016-01-08 DIAGNOSIS — Z1389 Encounter for screening for other disorder: Secondary | ICD-10-CM | POA: Diagnosis not present

## 2016-01-08 DIAGNOSIS — E668 Other obesity: Secondary | ICD-10-CM | POA: Diagnosis not present

## 2016-01-15 DIAGNOSIS — Z1212 Encounter for screening for malignant neoplasm of rectum: Secondary | ICD-10-CM | POA: Diagnosis not present

## 2016-01-28 DIAGNOSIS — Z6836 Body mass index (BMI) 36.0-36.9, adult: Secondary | ICD-10-CM | POA: Diagnosis not present

## 2016-01-28 DIAGNOSIS — R030 Elevated blood-pressure reading, without diagnosis of hypertension: Secondary | ICD-10-CM | POA: Diagnosis not present

## 2016-02-02 DIAGNOSIS — R030 Elevated blood-pressure reading, without diagnosis of hypertension: Secondary | ICD-10-CM | POA: Diagnosis not present

## 2016-02-23 DIAGNOSIS — Z6837 Body mass index (BMI) 37.0-37.9, adult: Secondary | ICD-10-CM | POA: Diagnosis not present

## 2016-02-23 DIAGNOSIS — I10 Essential (primary) hypertension: Secondary | ICD-10-CM | POA: Diagnosis not present

## 2016-03-02 DIAGNOSIS — R310 Gross hematuria: Secondary | ICD-10-CM | POA: Diagnosis not present

## 2016-03-02 DIAGNOSIS — C672 Malignant neoplasm of lateral wall of bladder: Secondary | ICD-10-CM | POA: Diagnosis not present

## 2016-03-02 DIAGNOSIS — R102 Pelvic and perineal pain: Secondary | ICD-10-CM | POA: Diagnosis not present

## 2016-03-02 DIAGNOSIS — N393 Stress incontinence (female) (male): Secondary | ICD-10-CM | POA: Diagnosis not present

## 2016-03-24 DIAGNOSIS — I10 Essential (primary) hypertension: Secondary | ICD-10-CM | POA: Diagnosis not present

## 2016-03-24 DIAGNOSIS — E559 Vitamin D deficiency, unspecified: Secondary | ICD-10-CM | POA: Diagnosis not present

## 2016-03-24 DIAGNOSIS — M859 Disorder of bone density and structure, unspecified: Secondary | ICD-10-CM | POA: Diagnosis not present

## 2016-03-24 DIAGNOSIS — Z6836 Body mass index (BMI) 36.0-36.9, adult: Secondary | ICD-10-CM | POA: Diagnosis not present

## 2016-03-31 ENCOUNTER — Other Ambulatory Visit (HOSPITAL_COMMUNITY): Payer: Self-pay | Admitting: *Deleted

## 2016-04-02 ENCOUNTER — Ambulatory Visit (HOSPITAL_COMMUNITY)
Admission: RE | Admit: 2016-04-02 | Discharge: 2016-04-02 | Disposition: A | Discharge status: Home / Self Care | Payer: Medicare Other | Source: Ambulatory Visit | Attending: Internal Medicine | Admitting: Internal Medicine

## 2016-04-02 DIAGNOSIS — M810 Age-related osteoporosis without current pathological fracture: Secondary | ICD-10-CM | POA: Diagnosis not present

## 2016-04-02 MED ORDER — ZOLEDRONIC ACID 5 MG/100ML IV SOLN
INTRAVENOUS | Status: AC
  Administered 2016-04-02: 5 mg
  Filled 2016-04-02: qty 100

## 2016-04-02 MED ORDER — ZOLEDRONIC ACID 5 MG/100ML IV SOLN: 5.0000 mg | Freq: Once | INTRAVENOUS | Status: DC

## 2016-04-02 NOTE — Discharge Instructions (Signed)
Zoledronic Acid injection (Paget's Disease, Osteoporosis) °What is this medicine? °ZOLEDRONIC ACID (ZOE le dron ik AS id) lowers the amount of calcium loss from bone. It is used to treat Paget's disease and osteoporosis in women. °COMMON BRAND NAME(S): Reclast, Zometa °What should I tell my health care provider before I take this medicine? °They need to know if you have any of these conditions: °-aspirin-sensitive asthma °-cancer, especially if you are receiving medicines used to treat cancer °-dental disease or wear dentures °-infection °-kidney disease °-low levels of calcium in the blood °-past surgery on the parathyroid gland or intestines °-receiving corticosteroids like dexamethasone or prednisone °-an unusual or allergic reaction to zoledronic acid, other medicines, foods, dyes, or preservatives °-pregnant or trying to get pregnant °-breast-feeding °How should I use this medicine? °This medicine is for infusion into a vein. It is given by a health care professional in a hospital or clinic setting. °Talk to your pediatrician regarding the use of this medicine in children. This medicine is not approved for use in children. °What if I miss a dose? °It is important not to miss your dose. Call your doctor or health care professional if you are unable to keep an appointment. °What may interact with this medicine? °-certain antibiotics given by injection °-NSAIDs, medicines for pain and inflammation, like ibuprofen or naproxen °-some diuretics like bumetanide, furosemide °-teriparatide °What should I watch for while using this medicine? °Visit your doctor or health care professional for regular checkups. It may be some time before you see the benefit from this medicine. Do not stop taking your medicine unless your doctor tells you to. Your doctor may order blood tests or other tests to see how you are doing. °Women should inform their doctor if they wish to become pregnant or think they might be pregnant. There is a  potential for serious side effects to an unborn child. Talk to your health care professional or pharmacist for more information. °You should make sure that you get enough calcium and vitamin D while you are taking this medicine. Discuss the foods you eat and the vitamins you take with your health care professional. °Some people who take this medicine have severe bone, joint, and/or muscle pain. This medicine may also increase your risk for jaw problems or a broken thigh bone. Tell your doctor right away if you have severe pain in your jaw, bones, joints, or muscles. Tell your doctor if you have any pain that does not go away or that gets worse. °Tell your dentist and dental surgeon that you are taking this medicine. You should not have major dental surgery while on this medicine. See your dentist to have a dental exam and fix any dental problems before starting this medicine. Take good care of your teeth while on this medicine. Make sure you see your dentist for regular follow-up appointments. °What side effects may I notice from receiving this medicine? °Side effects that you should report to your doctor or health care professional as soon as possible: °-allergic reactions like skin rash, itching or hives, swelling of the face, lips, or tongue °-anxiety, confusion, or depression °-breathing problems °-changes in vision °-eye pain °-feeling faint or lightheaded, falls °-jaw pain, especially after dental work °-mouth sores °-muscle cramps, stiffness, or weakness °-redness, blistering, peeling or loosening of the skin, including inside the mouth °-trouble passing urine or change in the amount of urine °Side effects that usually do not require medical attention (report to your doctor or health care professional if   they continue or are bothersome): °-bone, joint, or muscle pain °-constipation °-diarrhea °-fever °-hair loss °-irritation at site where injected °-loss of appetite °-nausea, vomiting °-stomach  upset °-trouble sleeping °-trouble swallowing °-weak or tired °Where should I keep my medicine? °This drug is given in a hospital or clinic and will not be stored at home. °© 2017 [employer] (2013-07-28 14:19:57) ° °

## 2016-06-02 DIAGNOSIS — H5203 Hypermetropia, bilateral: Secondary | ICD-10-CM | POA: Diagnosis not present

## 2016-06-02 DIAGNOSIS — H3589 Other specified retinal disorders: Secondary | ICD-10-CM | POA: Diagnosis not present

## 2016-06-02 DIAGNOSIS — H31109 Choroidal degeneration, unspecified, unspecified eye: Secondary | ICD-10-CM | POA: Diagnosis not present

## 2016-06-02 DIAGNOSIS — H31101 Choroidal degeneration, unspecified, right eye: Secondary | ICD-10-CM | POA: Diagnosis not present

## 2016-06-02 DIAGNOSIS — H52223 Regular astigmatism, bilateral: Secondary | ICD-10-CM | POA: Diagnosis not present

## 2016-11-02 DIAGNOSIS — M25562 Pain in left knee: Secondary | ICD-10-CM | POA: Diagnosis not present

## 2016-11-02 DIAGNOSIS — Z6836 Body mass index (BMI) 36.0-36.9, adult: Secondary | ICD-10-CM | POA: Diagnosis not present

## 2016-12-08 ENCOUNTER — Encounter (INDEPENDENT_AMBULATORY_CARE_PROVIDER_SITE_OTHER): Payer: Self-pay | Admitting: Physician Assistant

## 2016-12-08 ENCOUNTER — Ambulatory Visit (INDEPENDENT_AMBULATORY_CARE_PROVIDER_SITE_OTHER): Payer: Medicare Other

## 2016-12-08 ENCOUNTER — Ambulatory Visit (INDEPENDENT_AMBULATORY_CARE_PROVIDER_SITE_OTHER): Payer: Medicare Other | Admitting: Physician Assistant

## 2016-12-08 DIAGNOSIS — M25561 Pain in right knee: Secondary | ICD-10-CM

## 2016-12-08 MED ORDER — BUPIVACAINE HCL 0.25 % IJ SOLN
4.0000 mL | INTRAMUSCULAR | Status: AC | PRN
  Administered 2016-12-08: 4 mL via INTRA_ARTICULAR

## 2016-12-08 MED ORDER — METHYLPREDNISOLONE ACETATE 40 MG/ML IJ SUSP
40.0000 mg | INTRAMUSCULAR | Status: AC | PRN
  Administered 2016-12-08: 40 mg via INTRA_ARTICULAR

## 2016-12-08 NOTE — Progress Notes (Signed)
Office Visit Note   Patient: Lindsay Burch           Date of Birth: 05-28-1950           MRN: [id number] Visit Date: 12/08/2016              Requested by: Marton Redwood, MD [address] [place], [place] [address] PCP: Marton Redwood, MD   Assessment & Plan: Visit Diagnoses:  1. Acute pain of right knee     Plan: Quad strengthening exercises are shown and patient. She'll continue to take her Advil. See her back in 2 weeks she is to be mindful of any mechanical symptoms. Questions encouraged and answered. She continues to have significant pain in the knee or mechanical symptoms we'll obtain an MRI of her knee to rule out meniscal tear.  Follow-Up Instructions: Return in about 2 weeks (around 12/22/2016).   Orders:  Orders Placed This Encounter  Procedures  . Large Joint Injection/Arthrocentesis  . XR KNEE 3 VIEW RIGHT   No orders of the defined types were placed in this encounter.     Procedures: Large Joint Inj Date/Time: 12/08/2016 4:55 PM Performed by: Pete Pelt Authorized by: Pete Pelt   Consent Given by:  Patient Indications:  Pain Location:  Knee Site:  R knee Needle Size:  22 G Approach:  Anterolateral Ultrasound Guidance: No   Fluoroscopic Guidance: No   Medications:  40 mg methylPREDNISolone acetate 40 MG/ML; 4 mL bupivacaine 0.25 % Aspiration Attempted: No   Patient tolerance:  Patient tolerated the procedure well with no immediate complications     Clinical Data: No additional findings.   Subjective: Chief Complaint  Patient presents with  . Right Knee - Pain    HPI [name] is a 66 year old female who was standing in her room place of work when her right knee gave way 10 days ago. Then this past Sunday at church she was going up some steps she fell again due to the fact knee giving way. She's had no known injury to the knee. She's tried a normal knee sleeve that she has a home and some Advil for pain. Saw primary care  physician was referred here for workup of the knee. She does note that initially after the first incident she had swelling in the knee for 4-5 days. She's had no other mechanical symptoms of the knee. Review of Systems Positive for right knee giving way, negative for catching locking painful popping right knee. History of present illness otherwise negative.  Objective: Vital Signs: There were no vitals taken for this visit.  Physical Exam  Constitutional: She is oriented to person, place, and time. She appears well-developed and well-nourished. No distress.  Pulmonary/Chest: Effort normal.  Neurological: She is alert and oriented to person, place, and time.  Skin: She is not diaphoretic.  Psychiatric: She has a normal mood and affect. Her behavior is normal.    Ortho Exam Right knee tenderness along the medial joint line. Slight valgus deformity right knee. No effusion abnormal warmth erythema of either knee. Anterior drawer is negative bilaterally. No instability valgus pressure as of either knee. Negative McMurray's on the left positive on the right. Specialty Comments:  No specialty comments available.  Imaging: Xr Knee 3 View Right  Result Date: 12/08/2016 Right knee AP lateral and sunrise view: No acute fractures. Knee is well located. Bilateral knees on the AP view show mild medial compartmental narrowing. Sunrise view and lateral view of the  right knee shows moderate right knee patellofemoral narrowing.    PMFS History: There are no active problems to display for this patient.  Past Medical History:  Diagnosis Date  . Allergy    seasonal  . Bladder cancer ([place])   . Hyperlipidemia   . Wears glasses     Family History  Problem Relation Age of Onset  . Heart disease Father 23       died from MI 12   . Colon cancer Neg Hx   . Colon polyps Neg Hx   . Esophageal cancer Neg Hx   . Rectal cancer Neg Hx   . Stomach cancer Neg Hx   . Congestive Heart Failure Mother         died 49 from this     Past Surgical History:  Procedure Laterality Date  . CESAREAN SECTION  X3  LAST ONE 1976   LAST ONE W/ TUBAL LIGATION  . COLONOSCOPY  2006    in [place]   . CYSTOSCOPY W/ URETERAL STENT PLACEMENT Bilateral 01/17/2013   Procedure: CYSTOSCOPY WITH BILATERAL RETROGRADE PYELOGRAM/ LEFT URETERAL STENT PLACEMENT;  Surgeon: Alexis Frock, MD;  Location: [facility];  Service: Urology;  Laterality: Bilateral;  . TONSILLECTOMY  AGE 89'S  . TRANSURETHRAL RESECTION OF BLADDER TUMOR N/A 01/17/2013   Procedure: TRANSURETHRAL RESECTION OF BLADDER TUMOR (TURBT);  Surgeon: Alexis Frock, MD;  Location: [facility];  Service: Urology;  Laterality: N/A;  . TUBAL LIGATION     Social History   Occupational History  . Not on file.   Social History Main Topics  . Smoking status: Former Smoker    Packs/day: 1.00    Years: 16.00    Types: Cigarettes    Quit date: 01/11/1993  . Smokeless tobacco: Never Used  . Alcohol use 0.0 oz/week     Comment: VERY RARE  . Drug use: No  . Sexual activity: Not on file

## 2016-12-22 ENCOUNTER — Ambulatory Visit (INDEPENDENT_AMBULATORY_CARE_PROVIDER_SITE_OTHER): Payer: Medicare Other | Admitting: Physician Assistant

## 2016-12-22 DIAGNOSIS — M25561 Pain in right knee: Secondary | ICD-10-CM

## 2016-12-22 NOTE — Progress Notes (Signed)
Office Visit Note   Patient: Lindsay Burch           Date of Birth: [birth year]           MRN: [id number] Visit Date: 12/22/2016              Requested by: Marton Redwood, MD [address] [place], [place] [address] PCP: Marton Redwood, MD   Assessment & Plan: Visit Diagnoses:  1. Acute pain of right knee     Plan: We'll obtain an MRI of her right knee to rule out meniscal tear. She is claustrophobic and therefore will do an open MRI. Have her follow up after the MRI to go over the results and discuss further treatment.  Follow-Up Instructions: Return for After MRI.   Orders:  No orders of the defined types were placed in this encounter.  No orders of the defined types were placed in this encounter.     Procedures: No procedures performed   Clinical Data: No additional findings.   Subjective: Right knee pain  HPI Lindsay Burch returns today follow-up of her right knee. She states the injection that was given on 12/08/2016 helped until about 2 days ago. Then she started feeling a pinching sensation in the posterior aspect of her knee again. She has difficulty going up and down stairs and pain with going up and down stairs now. She denies any mechanical symptoms of the knee. Review of Systems Right knee no catching locking giving way or painful popping. Positive for right knee pain.  Objective: Vital Signs: There were no vitals taken for this visit.  Physical Exam  Constitutional: She is oriented to person, place, and time. She appears well-developed and well-nourished. No distress.  Neurological: She is alert and oriented to person, place, and time.  Skin: She is not diaphoretic.  Psychiatric: She has a normal mood and affect.    Ortho Exam Right knee she has full extension flexion however forced flexion causes pain medial aspect knee. She has tenderness along the medial joint line and medial posterior joint line. Also over the posterior lateral joint line.  Positive Reese. No instability valgus varus stressing. Specialty Comments:  No specialty comments available.  Imaging: No results found.   PMFS History: There are no active problems to display for this patient.  Past Medical History:  Diagnosis Date  . Allergy    seasonal  . Bladder cancer ([place])   . Hyperlipidemia   . Wears glasses     Family History  Problem Relation Age of Onset  . Heart disease Father 50       died from MI 71   . Colon cancer Neg Hx   . Colon polyps Neg Hx   . Esophageal cancer Neg Hx   . Rectal cancer Neg Hx   . Stomach cancer Neg Hx   . Congestive Heart Failure Mother        died 106 from this     Past Surgical History:  Procedure Laterality Date  . CESAREAN SECTION  X3  LAST ONE 1976   LAST ONE W/ TUBAL LIGATION  . COLONOSCOPY  2006    in [place]   . CYSTOSCOPY W/ URETERAL STENT PLACEMENT Bilateral 01/17/2013   Procedure: CYSTOSCOPY WITH BILATERAL RETROGRADE PYELOGRAM/ LEFT URETERAL STENT PLACEMENT;  Surgeon: Alexis Frock, MD;  Location: [facility];  Service: Urology;  Laterality: Bilateral;  . TONSILLECTOMY  AGE 15'S  . TRANSURETHRAL RESECTION OF BLADDER TUMOR N/A 01/17/2013  Procedure: TRANSURETHRAL RESECTION OF BLADDER TUMOR (TURBT);  Surgeon: Alexis Frock, MD;  Location: [facility];  Service: Urology;  Laterality: N/A;  . TUBAL LIGATION     Social History   Occupational History  . Not on file.   Social History Main Topics  . Smoking status: Former Smoker    Packs/day: 1.00    Years: 16.00    Types: Cigarettes    Quit date: 01/11/1993  . Smokeless tobacco: Never Used  . Alcohol use 0.0 oz/week     Comment: VERY RARE  . Drug use: No  . Sexual activity: Not on file

## 2016-12-25 DIAGNOSIS — Z23 Encounter for immunization: Secondary | ICD-10-CM | POA: Diagnosis not present

## 2016-12-27 ENCOUNTER — Telehealth (INDEPENDENT_AMBULATORY_CARE_PROVIDER_SITE_OTHER): Payer: Self-pay | Admitting: Orthopaedic Surgery

## 2016-12-27 NOTE — Telephone Encounter (Signed)
Patient called requesting the monovisc/synvisc injection for her knee. She said that Artis Delay discussed it with her at her last appointment last week and she decided she would like it get it as soon as possible. CB # [phone]

## 2016-12-28 NOTE — Telephone Encounter (Signed)
She had Medicare, so she can come whenever she wants-we use the stock here for Medicare patients

## 2016-12-30 ENCOUNTER — Ambulatory Visit (INDEPENDENT_AMBULATORY_CARE_PROVIDER_SITE_OTHER): Payer: Medicare Other | Admitting: Physician Assistant

## 2016-12-30 DIAGNOSIS — M25561 Pain in right knee: Secondary | ICD-10-CM | POA: Insufficient documentation

## 2016-12-30 NOTE — Progress Notes (Signed)
Lindsay Burch returns today due to right knee pian. She is requesting a injection in the right knee. The last injn her knee was given on 12/08/2016. She has a MRI of her knee scheduled on November 1. She'll be out of town until them. She continues to have a pinching sensation in the knee and giving way.I explained day after her MRI go over the results . No chargefor today's office visit

## 2017-01-13 ENCOUNTER — Ambulatory Visit
Admission: RE | Admit: 2017-01-13 | Discharge: 2017-01-13 | Disposition: A | Discharge status: Home / Self Care | Payer: Medicare Other | Source: Ambulatory Visit | Attending: Physician Assistant | Admitting: Physician Assistant

## 2017-01-13 DIAGNOSIS — M25561 Pain in right knee: Secondary | ICD-10-CM

## 2017-01-13 DIAGNOSIS — M859 Disorder of bone density and structure, unspecified: Secondary | ICD-10-CM | POA: Diagnosis not present

## 2017-01-13 DIAGNOSIS — R82998 Other abnormal findings in urine: Secondary | ICD-10-CM | POA: Diagnosis not present

## 2017-01-13 DIAGNOSIS — I10 Essential (primary) hypertension: Secondary | ICD-10-CM | POA: Diagnosis not present

## 2017-01-13 DIAGNOSIS — E7849 Other hyperlipidemia: Secondary | ICD-10-CM | POA: Diagnosis not present

## 2017-01-13 DIAGNOSIS — R7301 Impaired fasting glucose: Secondary | ICD-10-CM | POA: Diagnosis not present

## 2017-01-17 ENCOUNTER — Encounter (INDEPENDENT_AMBULATORY_CARE_PROVIDER_SITE_OTHER): Payer: Self-pay | Admitting: Physician Assistant

## 2017-01-17 ENCOUNTER — Ambulatory Visit (INDEPENDENT_AMBULATORY_CARE_PROVIDER_SITE_OTHER): Payer: Medicare Other | Admitting: Physician Assistant

## 2017-01-17 VITALS — Ht 60.0 in | Wt 176.0 lb

## 2017-01-17 DIAGNOSIS — S83203D Other tear of unspecified meniscus, current injury, right knee, subsequent encounter: Secondary | ICD-10-CM | POA: Diagnosis not present

## 2017-01-17 NOTE — Progress Notes (Signed)
Office Visit Note   Patient: Lindsay Burch           Date of Birth: [birth year]           MRN: [id number] Visit Date: 01/17/2017              Requested by: Marton Redwood, MD [address] [place], [place] [address] PCP: Marton Redwood, MD   Assessment & Plan: Visit Diagnoses:  1. Other tear of meniscus of right knee, unspecified meniscus, unspecified whether old or current tear, subsequent encounter     Plan: After reviewing the MRI findings with the patient and due to the fact that she continues to have pain despite conservative treatment I have mechanical symptoms of the knee recommend right knee arthroscopy with arthroscopic debridement and partial medial meniscectomy.  Discussed with her risk including PE, complications from anesthesia, infection, prolonged pain or worsening pain.  She would like to proceed with the knee arthroscopy with partial meniscectomy in the near future.  See her back 1 week postop.  Follow-Up Instructions: No Follow-up on file.   Orders:  No orders of the defined types were placed in this encounter.  No orders of the defined types were placed in this encounter.     Procedures: No procedures performed   Clinical Data: No additional findings.   Subjective: Chief Complaint  Patient presents with  . Right Knee - Follow-up    MRI review    HPI Lindsay Burch returns today for follow-up of her right knee pain.  She underwent an MRI which she is here to review.  She continues to have right knee pain and giving way of the knee.  States that she still feels something pinching it is painful in the back of the knee. MRI of the right knee is reviewed with the patient today a knee model is used as a visual aid.  MRI right knee dated 01/13/2017 showed complete radial tear through the posterior horn of the medial meniscus just peripheral to the meniscal root.  Body of the meniscus extruded peripherally with an extra substance degenerative signal and fraying.   Patellofemoral joint with extensive cartilage loss diffusely about the patella.  Mild degeneration lateral compartment thinning of the medial compartment without a focal defect.  Review of Systems Positive for right knee pain and giving way.  Otherwise review of systems negative  Objective: Vital Signs: Ht 5' (1.524 m)   Wt 176 lb (79.8 kg)   BMI 34.37 kg/m   Physical Exam  Constitutional: She is oriented to person, place, and time. She appears well-developed and well-nourished. No distress.  Neurological: She is alert and oriented to person, place, and time.  Skin: She is not diaphoretic.  Psychiatric: She has a normal mood and affect.    Ortho Exam Right knee tenderness along medial joint line.  No instability with valgus varus stressing overall good range of motion.  Specialty Comments:  No specialty comments available.  Imaging: No results found.   PMFS History: Patient Active Problem List   Diagnosis Date Noted  . Acute pain of right knee 12/30/2016   Past Medical History:  Diagnosis Date  . Allergy    seasonal  . Bladder cancer ([place])   . Hyperlipidemia   . Wears glasses     Family History  Problem Relation Age of Onset  . Heart disease Father 60       died from MI 39   . Colon cancer Neg Hx   .  Colon polyps Neg Hx   . Esophageal cancer Neg Hx   . Rectal cancer Neg Hx   . Stomach cancer Neg Hx   . Congestive Heart Failure Mother        died 34 from this     Past Surgical History:  Procedure Laterality Date  . CESAREAN SECTION  X3  LAST ONE 1976   LAST ONE W/ TUBAL LIGATION  . COLONOSCOPY  2006    in [place]   . TONSILLECTOMY  AGE 54'S  . TUBAL LIGATION     Social History   Occupational History  . Not on file  Tobacco Use  . Smoking status: Former Smoker    Packs/day: 1.00    Years: 16.00    Pack years: 16.00    Types: Cigarettes    Last attempt to quit: 01/11/1993    Years since quitting: 24.0  . Smokeless tobacco: Never Used  Substance and  Sexual Activity  . Alcohol use: Yes    Alcohol/week: 0.0 oz    Comment: VERY RARE  . Drug use: No  . Sexual activity: Not on file

## 2017-01-20 DIAGNOSIS — E7849 Other hyperlipidemia: Secondary | ICD-10-CM | POA: Diagnosis not present

## 2017-01-20 DIAGNOSIS — R7301 Impaired fasting glucose: Secondary | ICD-10-CM | POA: Diagnosis not present

## 2017-01-20 DIAGNOSIS — Z0000 Encounter for general adult medical examination without abnormal findings: Secondary | ICD-10-CM | POA: Diagnosis not present

## 2017-01-20 DIAGNOSIS — M25561 Pain in right knee: Secondary | ICD-10-CM | POA: Diagnosis not present

## 2017-01-20 DIAGNOSIS — Z23 Encounter for immunization: Secondary | ICD-10-CM | POA: Diagnosis not present

## 2017-01-20 DIAGNOSIS — E668 Other obesity: Secondary | ICD-10-CM | POA: Diagnosis not present

## 2017-01-20 DIAGNOSIS — Z1389 Encounter for screening for other disorder: Secondary | ICD-10-CM | POA: Diagnosis not present

## 2017-01-20 DIAGNOSIS — I10 Essential (primary) hypertension: Secondary | ICD-10-CM | POA: Diagnosis not present

## 2017-01-20 DIAGNOSIS — M8580 Other specified disorders of bone density and structure, unspecified site: Secondary | ICD-10-CM | POA: Diagnosis not present

## 2017-01-20 DIAGNOSIS — R030 Elevated blood-pressure reading, without diagnosis of hypertension: Secondary | ICD-10-CM | POA: Diagnosis not present

## 2017-01-20 DIAGNOSIS — Z8551 Personal history of malignant neoplasm of bladder: Secondary | ICD-10-CM | POA: Diagnosis not present

## 2017-01-20 DIAGNOSIS — E559 Vitamin D deficiency, unspecified: Secondary | ICD-10-CM | POA: Diagnosis not present

## 2017-01-27 DIAGNOSIS — Z1212 Encounter for screening for malignant neoplasm of rectum: Secondary | ICD-10-CM | POA: Diagnosis not present

## 2017-02-15 DIAGNOSIS — R102 Pelvic and perineal pain: Secondary | ICD-10-CM | POA: Diagnosis not present

## 2017-02-15 DIAGNOSIS — N393 Stress incontinence (female) (male): Secondary | ICD-10-CM | POA: Diagnosis not present

## 2017-02-15 DIAGNOSIS — C672 Malignant neoplasm of lateral wall of bladder: Secondary | ICD-10-CM | POA: Diagnosis not present

## 2017-02-17 DIAGNOSIS — M94261 Chondromalacia, right knee: Secondary | ICD-10-CM | POA: Diagnosis not present

## 2017-02-17 DIAGNOSIS — M23221 Derangement of posterior horn of medial meniscus due to old tear or injury, right knee: Secondary | ICD-10-CM | POA: Diagnosis not present

## 2017-02-17 DIAGNOSIS — G8918 Other acute postprocedural pain: Secondary | ICD-10-CM | POA: Diagnosis not present

## 2017-02-17 DIAGNOSIS — M65861 Other synovitis and tenosynovitis, right lower leg: Secondary | ICD-10-CM | POA: Diagnosis not present

## 2017-02-24 ENCOUNTER — Ambulatory Visit (INDEPENDENT_AMBULATORY_CARE_PROVIDER_SITE_OTHER): Payer: Medicare Other | Admitting: Physician Assistant

## 2017-02-24 ENCOUNTER — Encounter (INDEPENDENT_AMBULATORY_CARE_PROVIDER_SITE_OTHER): Payer: Self-pay | Admitting: Physician Assistant

## 2017-02-24 DIAGNOSIS — Z9889 Other specified postprocedural states: Secondary | ICD-10-CM

## 2017-02-24 NOTE — Progress Notes (Signed)
[name] returns today status post right knee arthroscopy with partial medial meniscectomy.  She states overall she is doing okay.  Some stiffness.  Been taking Advil for pain.  She has had no shortness of breath fevers chills calf pain.  Pictures were reviewed with the patient today which do show severe arthritis involving the medial compartment particularly the medial femoral condyle which is denuded cartilage.  Physical exam: Right knee good range of motion.  Calf supple nontender.  Port sites well-healed with nylon sutures well approximating incisions.  Impression: Status post right knee arthroscopy with partial medial meniscectomy   Plan: Sutures removed.  She will work on scar tissue mobilization.  Work on Forensic scientist.  See her back in 1 month check progress lack of.  She may benefit from supplemental injection in the future.

## 2017-03-24 DIAGNOSIS — Z79899 Other long term (current) drug therapy: Secondary | ICD-10-CM | POA: Diagnosis not present

## 2017-03-24 DIAGNOSIS — Z6835 Body mass index (BMI) 35.0-35.9, adult: Secondary | ICD-10-CM | POA: Diagnosis not present

## 2017-03-24 DIAGNOSIS — M859 Disorder of bone density and structure, unspecified: Secondary | ICD-10-CM | POA: Diagnosis not present

## 2017-03-28 ENCOUNTER — Ambulatory Visit (INDEPENDENT_AMBULATORY_CARE_PROVIDER_SITE_OTHER): Payer: Medicare Other | Admitting: Physician Assistant

## 2017-03-30 ENCOUNTER — Ambulatory Visit (INDEPENDENT_AMBULATORY_CARE_PROVIDER_SITE_OTHER): Payer: Medicare Other | Admitting: Physician Assistant

## 2017-03-30 ENCOUNTER — Encounter (INDEPENDENT_AMBULATORY_CARE_PROVIDER_SITE_OTHER): Payer: Self-pay | Admitting: Physician Assistant

## 2017-03-30 DIAGNOSIS — Z9889 Other specified postprocedural states: Secondary | ICD-10-CM

## 2017-03-30 DIAGNOSIS — G8929 Other chronic pain: Secondary | ICD-10-CM

## 2017-03-30 DIAGNOSIS — M25561 Pain in right knee: Secondary | ICD-10-CM

## 2017-03-30 NOTE — Progress Notes (Signed)
[name] returns today for follow-up of her right knee status post right knee arthroscopy with partial medial meniscectomy.  She did need femoral condyle.  Some chondromalacia patella.  She is still having pain in the knee.  She taken Advil but this irritated her stomach.  She has difficulty sleeping at night due to the pain.  She is asking what else she can do with knee.  She has problems going up and down steps.    Physical exam: Right knee good range of motion.Slight scar tissue lateral port site.  Calf supple nontender.  Tenderness along medial joint line.  Positive crepitus with passive range of motion of the knee from the patellofemoral joint.  Impression 5 weeks status post right knee arthroscopy with partial medial meniscectomy plan: Reviewed with her quad strengthening exercises.  Discussed nutritionally exercises.  We will send her to formal physical therapy for quad strengthening and home exercise program.  She is given a handout on Monovisc which she may benefit from in the future.  She is also given Pennsaid samples and see if this helps with her knee pain if it does we can either call in NSAID or Voltaren gel prescription for her.  See her back in 6 weeks.

## 2017-04-04 ENCOUNTER — Other Ambulatory Visit (INDEPENDENT_AMBULATORY_CARE_PROVIDER_SITE_OTHER): Payer: Self-pay

## 2017-04-04 ENCOUNTER — Telehealth (INDEPENDENT_AMBULATORY_CARE_PROVIDER_SITE_OTHER): Payer: Self-pay | Admitting: Physician Assistant

## 2017-04-04 DIAGNOSIS — Z9889 Other specified postprocedural states: Secondary | ICD-10-CM

## 2017-04-04 MED ORDER — TRAMADOL HCL 50 MG PO TABS: 50.0000 mg | ORAL_TABLET | Freq: Four times a day (QID) | ORAL | 0 refills | Status: DC | PRN

## 2017-04-04 NOTE — Telephone Encounter (Signed)
tramdol #[id number] zero one po q 6

## 2017-04-04 NOTE — Telephone Encounter (Signed)
Called into pharmacy for patient

## 2017-04-04 NOTE — Telephone Encounter (Signed)
Patient saw Artis Delay on Thursday in regards to pain medication.  She stated that the Advil is not working and would like something stronger called in for her. She is using her brace, but had trouble sleeping last night because of the pain. Patient uses [facility].  CB#[phone].  Thank you.

## 2017-04-04 NOTE — Telephone Encounter (Signed)
Please advise 

## 2017-04-05 ENCOUNTER — Encounter (HOSPITAL_COMMUNITY): Payer: Medicare Other

## 2017-04-06 ENCOUNTER — Other Ambulatory Visit (HOSPITAL_COMMUNITY): Payer: Self-pay

## 2017-04-07 ENCOUNTER — Ambulatory Visit (HOSPITAL_COMMUNITY)
Admission: RE | Admit: 2017-04-07 | Discharge: 2017-04-07 | Disposition: A | Discharge status: Home / Self Care | Payer: Medicare Other | Source: Ambulatory Visit | Attending: Internal Medicine | Admitting: Internal Medicine

## 2017-04-07 DIAGNOSIS — M810 Age-related osteoporosis without current pathological fracture: Secondary | ICD-10-CM | POA: Diagnosis not present

## 2017-04-07 MED ORDER — ZOLEDRONIC ACID 5 MG/100ML IV SOLN: 5.0000 mg | Freq: Once | INTRAVENOUS | Status: DC

## 2017-04-07 MED ORDER — ZOLEDRONIC ACID 5 MG/100ML IV SOLN
INTRAVENOUS | Status: AC
  Administered 2017-04-07: 5 mg
  Filled 2017-04-07: qty 100

## 2017-04-08 ENCOUNTER — Ambulatory Visit: Payer: Medicare Other | Attending: Physician Assistant | Admitting: Physical Therapy

## 2017-04-08 ENCOUNTER — Encounter: Payer: Self-pay | Admitting: Physical Therapy

## 2017-04-08 ENCOUNTER — Other Ambulatory Visit: Payer: Self-pay

## 2017-04-08 DIAGNOSIS — R29898 Other symptoms and signs involving the musculoskeletal system: Secondary | ICD-10-CM | POA: Insufficient documentation

## 2017-04-08 DIAGNOSIS — M25561 Pain in right knee: Secondary | ICD-10-CM | POA: Insufficient documentation

## 2017-04-08 DIAGNOSIS — R262 Difficulty in walking, not elsewhere classified: Secondary | ICD-10-CM | POA: Diagnosis not present

## 2017-04-08 NOTE — Therapy (Signed)
[facility] [address]  [place], [place], [address] Phone: [phone]   Fax:  [phone]  Physical Therapy Evaluation  Patient Details  Name: Lindsay [place] MRN: [id number] Date of Birth: Apr 01, 1950 Referring Provider: Erskine Emery, PA   Encounter Date: 04/08/2017  PT End of Session - 04/08/17 0924    Visit Number  1    Number of Visits  12    Date for PT Re-Evaluation  05/20/17    Authorization Type  Medicare    PT Start Time  0844    PT Stop Time  0928    PT Time Calculation (min)  44 min    Activity Tolerance  Patient limited by pain    Behavior During Therapy  [facility] for tasks assessed/performed       Past Medical History:  Diagnosis Date  . Allergy    seasonal  . Bladder cancer ([place])   . Hyperlipidemia   . Wears glasses     Past Surgical History:  Procedure Laterality Date  . CESAREAN SECTION  X3  LAST ONE 1976   LAST ONE W/ TUBAL LIGATION  . COLONOSCOPY  2006    in [place]   . CYSTOSCOPY W/ URETERAL STENT PLACEMENT Bilateral 01/17/2013   Procedure: CYSTOSCOPY WITH BILATERAL RETROGRADE PYELOGRAM/ LEFT URETERAL STENT PLACEMENT;  Surgeon: Alexis Frock, MD;  Location: [facility];  Service: Urology;  Laterality: Bilateral;  . TONSILLECTOMY  AGE 53'S  . TRANSURETHRAL RESECTION OF BLADDER TUMOR N/A 01/17/2013   Procedure: TRANSURETHRAL RESECTION OF BLADDER TUMOR (TURBT);  Surgeon: Alexis Frock, MD;  Location: [facility];  Service: Urology;  Laterality: N/A;  . TUBAL LIGATION      There were no vitals filed for this visit.   Subjective Assessment - 04/08/17 0844    Subjective  s/p arthroscopy on 12/6 - 2 meniscus tears. Has been in pain ever since. Controlling pain with ice and Advil/tramadol. Has pain with wearing pants or any fabric over knee. Discomfort with sitting - puts foot on ledge. Can only stand at desk for 15 min/hour currently due to pain.     Patient  Stated Goals  improve pain - return to walking daily    Currently in Pain?  Yes    Pain Score  2  up to 8/10    Pain Location  Knee    Pain Orientation  Right    Pain Descriptors / Indicators  Aching;Constant    Pain Type  Acute pain;Surgical pain    Pain Onset  More than a month ago    Pain Frequency  Intermittent    Aggravating Factors   prolonged positioning, standing         [facility] PT Assessment - 04/08/17 0001      Assessment   Medical Diagnosis  s/p R knee arthroscopy    Referring Provider  Erskine Emery, PA    Onset Date/Surgical Date  02/17/18    Next MD Visit  05/16/17    Prior Therapy  no      Precautions   Precautions  None      Restrictions   Weight Bearing Restrictions  No      Balance Screen   Has the patient fallen in the past 6 months  No    Has the patient had a decrease in activity level because of a fear of falling?   No    Is the patient reluctant to leave their  home because of a fear of falling?   No      Home Environment   Living Environment  Private residence    Type of [place] to enter    Entrance Stairs-Number of Steps  2    Home Layout  Two level;Able to live on main level with bedroom/bathroom    Alternate Level Stairs-Number of Steps  14      Prior Function   Level of Independence  Independent    Vocation  Full time employment    Vocation Requirements  [employer]   Overall Cognitive Status  Within Functional Limits for tasks assessed      Observation/Other Assessments   Focus on Therapeutic Outcomes (FOTO)   Knee: 44 (56% limited, predicted 38% limited)      Sensation   Light Touch  Appears Intact      Coordination   Gross Motor Movements are Fluid and Coordinated  Yes      Posture/Postural Control   Posture/Postural Control  Postural limitations    Postural Limitations  Rounded Shoulders;Forward head      ROM / Strength   AROM / PROM / Strength  AROM;Strength      AROM   AROM  Assessment Site  Knee    Right/Left Knee  Right    Right Knee Extension  1    Right Knee Flexion  122      Strength   Overall Strength Comments  R LE grossly 4-/5      Flexibility   Soft Tissue Assessment /Muscle Length  yes    Hamstrings  R mild tightness      Palpation   Patella mobility  slight hypomobility of R patella in all planes    Palpation comment  TTP along medial joint line, posterior knee joint      Ambulation/Gait   Ambulation/Gait  Yes    Ambulation/Gait Assistance  6: Modified independent (Device/Increase time)    Ambulation Distance (Feet)  100 Feet    Assistive device  None    Gait Pattern  Step-through pattern;Decreased step length - left;Decreased stance time - right;Decreased hip/knee flexion - right;Decreased dorsiflexion - right;Decreased weight shift to right;Antalgic    Ambulation Surface  Level;Indoor             Objective measurements completed on examination: See above findings.      [facility] Adult PT Treatment/Exercise - 04/08/17 0001      Exercises   Exercises  Knee/Hip      Knee/Hip Exercises: Stretches   Passive Hamstring Stretch  Right;2 reps;30 seconds      Knee/Hip Exercises: Standing   Heel Raises  Both;15 reps    Functional Squat  15 reps      Knee/Hip Exercises: Seated   Long Arc Quad  Right;10 reps      Knee/Hip Exercises: Supine   Bridges  Both;10 reps    Straight Leg Raises  Right;10 reps             PT Education - 04/08/17 [phone]    Education provided  Yes    Education Details  exam findings, POC, HEP    Person(s) Educated  Patient    Methods  Explanation;Demonstration;Handout    Comprehension  Verbalized understanding;Returned demonstration          PT Long Term Goals - 04/08/17 1152      PT LONG TERM GOAL #1  Title  patient to be independent with advanced HEP    Status  New    Target Date  05/20/17      PT LONG TERM GOAL #2   Title  patient to demonstrate good heel toe gait pattern on various  surfaces    Status  New    Target Date  05/20/17      PT LONG TERM GOAL #3   Title  patient to demonstrate stair navigation with reciprocal gait pattern with no evidence of instability    Status  New    Target Date  05/20/17      PT LONG TERM GOAL #4   Title  patient to improve R LE strength to >/= 4+/5    Status  New    Target Date  05/20/17      PT LONG TERM GOAL #5   Title  patient to report reduction in pain by >/= 75% for greater than 2 weeks    Status  New    Target Date  05/20/17             Plan - 04/08/17 1150    Clinical Impression Statement  Ms. Mesta is a pleasant 66 y/o female presenting to [place] today s/p R knee arthroscopy on 02/17/17. Patient ambulating with altered gait mechanics with reduced weight shift to R LE. patient with good AROM at R knee, but with reduced strength, as expected. Patient given initial HEP for gentle stretching and strenghteing with good tolerance and carryover. patient to benefit from PT to address pain and functional mobility limitations.     Clinical Presentation  Stable    Clinical Decision Making  Low    Rehab Potential  Good    PT Frequency  2x / week    PT Duration  6 weeks    PT Treatment/Interventions  ADLs/Self Care Home Management;Cryotherapy;Electrical Stimulation;Iontophoresis 4mg /ml Dexamethasone;Moist Heat;Therapeutic exercise;Therapeutic activities;Functional mobility training;Stair training;Gait training;Ultrasound;Balance training;Neuromuscular re-education;Manual techniques;Vasopneumatic Device;Taping;Dry needling;Passive range of motion    Consulted and Agree with Plan of Care  Patient       Patient will benefit from skilled therapeutic intervention in order to improve the following deficits and impairments:  Abnormal gait, Decreased activity tolerance, Decreased range of motion, Decreased mobility, Difficulty walking, Pain, Decreased strength  Visit Diagnosis: Acute pain of right knee - Plan: PT plan of care  cert/re-cert  Other symptoms and signs involving the musculoskeletal system - Plan: PT plan of care cert/re-cert  Difficulty in walking, not elsewhere classified - Plan: PT plan of care cert/re-cert     Problem List Patient Active Problem List   Diagnosis Date Noted  . Status post arthroscopy of right knee 02/24/2017  . Acute pain of right knee 12/30/2016     Lanney Gins, PT, DPT 04/08/17 11:55 AM   [facility] [address]  [place], [place], [address] Phone: [phone]   Fax:  [phone]  Name: Lindsay [place] MRN: [id number] Date of Birth: 04-30-1950

## 2017-04-08 NOTE — Patient Instructions (Signed)
Hamstring Step 2   Left foot relaxed, knee straight, other leg bent, foot flat. Raise straight leg further upward to maximal range. Hold _30__ seconds. Relax leg completely down. Repeat _3__ times.  Strengthening: Straight Leg Raise (Phase 1)   Tighten muscles on front of right thigh, then lift leg __~8__ inches from surface, keeping knee locked.  Repeat __15__ times per set. Do __2__ sets per session.   Straight Leg Raise: With External Leg Rotation   Lie on back with right leg straight, opposite leg bent. Rotate straight leg out and lift __~8__ inches. Repeat __15__ times per set. Do _2___ sets per session.   Bridge   Lie back, legs bent. Inhale, pressing hips up. Keeping ribs in, lengthen lower back. Exhale, rolling down along spine from top. Repeat __15__ times. Do __2__ sessions per day.  Long [employer]   Straighten operated leg and try to hold it __5__ seconds. Use __-__ lbs on ankle. Repeat __15__ times. Do __2__ sessions a day.  Mini Squat: Double Leg   With feet shoulder width apart, reach forward for balance and do a mini squat. Keep knees in line with second toe. Knees do not go past toes. Repeat __15_ times per set.   Heel Raise: Bilateral (Standing)   Rise on balls of feet. Repeat __15__ times per set.

## 2017-04-12 ENCOUNTER — Encounter (HOSPITAL_COMMUNITY): Payer: Medicare Other

## 2017-04-14 ENCOUNTER — Ambulatory Visit: Payer: Medicare Other

## 2017-04-14 DIAGNOSIS — R29898 Other symptoms and signs involving the musculoskeletal system: Secondary | ICD-10-CM | POA: Diagnosis not present

## 2017-04-14 DIAGNOSIS — M25561 Pain in right knee: Secondary | ICD-10-CM | POA: Diagnosis not present

## 2017-04-14 DIAGNOSIS — R262 Difficulty in walking, not elsewhere classified: Secondary | ICD-10-CM

## 2017-04-14 NOTE — Therapy (Signed)
[facility] [address]  [place], [place], [address] Phone: [phone]   Fax:  [phone]  Physical Therapy Treatment  Patient Details  Name: Lindsay [place] MRN: [id number] Date of Birth: [birth year] Referring Provider: Erskine Emery, PA   Encounter Date: 04/14/2017  PT End of Session - 04/14/17 0802    Visit Number  2    Number of Visits  12    Date for PT Re-Evaluation  05/20/17    Authorization Type  Medicare    PT Start Time  0756    PT Stop Time  0840    PT Time Calculation (min)  44 min    Activity Tolerance  Patient tolerated treatment well    Behavior During Therapy  [facility] for tasks assessed/performed       Past Medical History:  Diagnosis Date  . Allergy    seasonal  . Bladder cancer ([place])   . Hyperlipidemia   . Wears glasses     Past Surgical History:  Procedure Laterality Date  . CESAREAN SECTION  X3  LAST ONE 1976   LAST ONE W/ TUBAL LIGATION  . COLONOSCOPY  2006    in [place]   . CYSTOSCOPY W/ URETERAL STENT PLACEMENT Bilateral 01/17/2013   Procedure: CYSTOSCOPY WITH BILATERAL RETROGRADE PYELOGRAM/ LEFT URETERAL STENT PLACEMENT;  Surgeon: Alexis Frock, MD;  Location: [facility];  Service: Urology;  Laterality: Bilateral;  . TONSILLECTOMY  AGE 64'S  . TRANSURETHRAL RESECTION OF BLADDER TUMOR N/A 01/17/2013   Procedure: TRANSURETHRAL RESECTION OF BLADDER TUMOR (TURBT);  Surgeon: Alexis Frock, MD;  Location: [facility];  Service: Urology;  Laterality: N/A;  . TUBAL LIGATION      There were no vitals filed for this visit.  Subjective Assessment - 04/14/17 0759    Subjective  Pt. reporting HEP activities have made her knee feel better.      Patient Stated Goals  improve pain - return to walking daily    Currently in Pain?  Yes    Pain Score  1     Pain Location  Knee    Pain Orientation  Right;Medial    Pain Type  Acute pain;Surgical pain    Pain Onset   More than a month ago    Multiple Pain Sites  No                      [facility] Adult PT Treatment/Exercise - 04/14/17 0809      Knee/Hip Exercises: Stretches   Passive Hamstring Stretch  Right;2 reps;30 seconds required cuieng for hold time       Knee/Hip Exercises: Aerobic   Nustep  Lvl 2, 6 min - LE only       Knee/Hip Exercises: Standing   Heel Raises  Both;15 reps    Terminal Knee Extension  Right;10 reps;Strengthening    Theraband Level (Terminal Knee Extension)  Level 3 (Green)    Terminal Knee Extension Limitations  band in door    Functional Squat  15 reps Required cueing to avoid excessive posterior wt. shift     Functional Squat Limitations  counter support     Wall Squat  10 reps;3 seconds    Wall Squat Limitations  leaning on orange p-ball     Other Standing Knee Exercises  HS curl with strap assist for end range stretch with heels on peanut p-ball 5" x 10 reps  Knee/Hip Exercises: Seated   Other Seated Knee/Hip Exercises  R fitter leg press (1 black band) x 10 reps     Hamstring Curl  Right;10 reps    Hamstring Limitations  red TB       Knee/Hip Exercises: Supine   Hip Adduction Isometric  Both;10 reps    Hip Adduction Isometric Limitations  5" hold; adduction ball squeze     Bridges  Both;10 reps required cueing for pacing     Straight Leg Raises  Right;15 reps    Straight Leg Raise with External Rotation  Right;15 reps required for proper technique                   PT Long Term Goals - 04/14/17 0804      PT LONG TERM GOAL #1   Title  patient to be independent with advanced HEP    Status  On-going      PT LONG TERM GOAL #2   Title  patient to demonstrate good heel toe gait pattern on various surfaces    Status  On-going      PT LONG TERM GOAL #3   Title  patient to demonstrate stair navigation with reciprocal gait pattern with no evidence of instability    Status  On-going      PT LONG TERM GOAL #4   Title  patient to  improve R LE strength to >/= 4+/5    Status  On-going      PT LONG TERM GOAL #5   Title  patient to report reduction in pain by >/= 75% for greater than 2 weeks    Status  On-going            Plan - 04/14/17 0802    Clinical Impression Statement  Pt. doing well today reporting daily adherence to HEP.  Pt. did require frequent cueing today for pacing, proper hold times, and positioning with HEP review however able to perform correctly following review.  Manus Gunning tolerated mild progression of LE strengthening well today and ended treatment pain free.  Will continue to progress toward goals.       PT Treatment/Interventions  ADLs/Self Care Home Management;Cryotherapy;Electrical Stimulation;Iontophoresis 4mg /ml Dexamethasone;Moist Heat;Therapeutic exercise;Therapeutic activities;Functional mobility training;Stair training;Gait training;Ultrasound;Balance training;Neuromuscular re-education;Manual techniques;Vasopneumatic Device;Taping;Dry needling;Passive range of motion    Consulted and Agree with Plan of Care  Patient       Patient will benefit from skilled therapeutic intervention in order to improve the following deficits and impairments:  Abnormal gait, Decreased activity tolerance, Decreased range of motion, Decreased mobility, Difficulty walking, Pain, Decreased strength  Visit Diagnosis: Acute pain of right knee  Other symptoms and signs involving the musculoskeletal system  Difficulty in walking, not elsewhere classified     Problem List Patient Active Problem List   Diagnosis Date Noted  . Status post arthroscopy of right knee 02/24/2017  . Acute pain of right knee 12/30/2016    Bess Harvest, PTA 04/14/17 9:57 AM  [facility] [address]  [address] [place], [place], [address] Phone: [phone]   Fax:  [phone]  Name: Lindsay [facility] MRN: [id number] Date of Birth: 21-Nov-1950

## 2017-04-18 ENCOUNTER — Encounter: Payer: Self-pay | Admitting: Physical Therapy

## 2017-04-18 ENCOUNTER — Ambulatory Visit: Payer: Medicare Other | Attending: Physician Assistant | Admitting: Physical Therapy

## 2017-04-18 DIAGNOSIS — R262 Difficulty in walking, not elsewhere classified: Secondary | ICD-10-CM

## 2017-04-18 DIAGNOSIS — M25561 Pain in right knee: Secondary | ICD-10-CM | POA: Diagnosis not present

## 2017-04-18 DIAGNOSIS — R29898 Other symptoms and signs involving the musculoskeletal system: Secondary | ICD-10-CM

## 2017-04-18 NOTE — Therapy (Signed)
[facility] [address]  [place], [place], [address] Phone: [phone]   Fax:  [phone]  Physical Therapy Treatment  Patient Details  Name: Lindsay [place] MRN: [id number] Date of Birth: [birth year] Referring Provider: Erskine Emery, PA   Encounter Date: 04/18/2017  PT End of Session - 04/18/17 0805    Visit Number  3    Number of Visits  12    Date for PT Re-Evaluation  05/20/17    Authorization Type  Medicare    PT Start Time  0755    PT Stop Time  0836    PT Time Calculation (min)  41 min    Activity Tolerance  Patient tolerated treatment well    Behavior During Therapy  [facility] for tasks assessed/performed       Past Medical History:  Diagnosis Date  . Allergy    seasonal  . Bladder cancer ([place])   . Hyperlipidemia   . Wears glasses     Past Surgical History:  Procedure Laterality Date  . CESAREAN SECTION  X3  LAST ONE 1976   LAST ONE W/ TUBAL LIGATION  . COLONOSCOPY  2006    in [place]   . CYSTOSCOPY W/ URETERAL STENT PLACEMENT Bilateral 01/17/2013   Procedure: CYSTOSCOPY WITH BILATERAL RETROGRADE PYELOGRAM/ LEFT URETERAL STENT PLACEMENT;  Surgeon: Alexis Frock, MD;  Location: [facility];  Service: Urology;  Laterality: Bilateral;  . TONSILLECTOMY  AGE 65'S  . TRANSURETHRAL RESECTION OF BLADDER TUMOR N/A 01/17/2013   Procedure: TRANSURETHRAL RESECTION OF BLADDER TUMOR (TURBT);  Surgeon: Alexis Frock, MD;  Location[facility];  Service: Urology;  Laterality: N/A;  . TUBAL LIGATION      There were no vitals filed for this visit.  Subjective Assessment - 04/18/17 0758    Subjective  Had an increase in pain on Friday after Thursdays PT session (joint pain)    Patient Stated Goals  improve pain - return to walking daily    Currently in Pain?  No/denies    Pain Score  0-No pain    Pain Location  Knee    Pain Orientation  Right                       [facility] Adult PT Treatment/Exercise - 04/18/17 0001      Knee/Hip Exercises: Stretches   Gastroc Stretch  Right;3 reps;30 seconds prostretch      Knee/Hip Exercises: Aerobic   Recumbent Bike  L2 x 6 min      Knee/Hip Exercises: Machines for Strengthening   Cybex Knee Extension  B LE - 10# x 15 reps    Cybex Knee Flexion  B LE - 15# x 15 reps      Knee/Hip Exercises: Standing   Step Down  Right;15 reps;Hand Hold: 2;Step Height: 4"      Knee/Hip Exercises: Seated   Long Arc Quad  Strengthening;Right;15 reps;Weights + ball squeeze    Long Arc Quad Weight  2 lbs.      Knee/Hip Exercises: Supine   Bridges with Greig Right  Strengthening;Both;15 reps    Straight Leg Raises  Strengthening;Right;15 reps 2#    Straight Leg Raise with External Rotation  Strengthening;Right;15 reps 23    Other Supine Knee/Hip Exercises  HS bridge x 15 reps      Knee/Hip Exercises: Sidelying   Hip ABduction  Strengthening;Right;15 reps      Modalities   Modalities  Iontophoresis      Iontophoresis   Type of Iontophoresis  Dexamethasone    Location  R anterior/medial knee    Dose  1.0 mL    Time  80 mA; 4-6 hours                  PT Long Term Goals - 04/14/17 0804      PT LONG TERM GOAL #1   Title  patient to be independent with advanced HEP    Status  On-going      PT LONG TERM GOAL #2   Title  patient to demonstrate good heel toe gait pattern on various surfaces    Status  On-going      PT LONG TERM GOAL #3   Title  patient to demonstrate stair navigation with reciprocal gait pattern with no evidence of instability    Status  On-going      PT LONG TERM GOAL #4   Title  patient to improve R LE strength to >/= 4+/5    Status  On-going      PT LONG TERM GOAL #5   Title  patient to report reduction in pain by >/= 75% for greater than 2 weeks    Status  On-going            Plan - 04/18/17 0805    Clinical Impression Statement  Patient  reporting some increased pain after last session, however resolved today. Good tolerance to resistance work with no issue today. Some education needed for utilization of HEP with good carryover. [place]onto applied today for hopeful pain reduction at medial knee that patient experiences throughout her day.     PT Treatment/Interventions  ADLs/Self Care Home Management;Cryotherapy;Electrical Stimulation;Iontophoresis 4mg /ml Dexamethasone;Moist Heat;Therapeutic exercise;Therapeutic activities;Functional mobility training;Stair training;Gait training;Ultrasound;Balance training;Neuromuscular re-education;Manual techniques;Vasopneumatic Device;Taping;Dry needling;Passive range of motion    Consulted and Agree with Plan of Care  Patient       Patient will benefit from skilled therapeutic intervention in order to improve the following deficits and impairments:  Abnormal gait, Decreased activity tolerance, Decreased range of motion, Decreased mobility, Difficulty walking, Pain, Decreased strength  Visit Diagnosis: Acute pain of right knee  Other symptoms and signs involving the musculoskeletal system  Difficulty in walking, not elsewhere classified     Problem List Patient Active Problem List   Diagnosis Date Noted  . Status post arthroscopy of right knee 02/24/2017  . Acute pain of right knee 12/30/2016     Lanney Gins, PT, DPT 04/18/17 8:40 AM   [facility] [address]  [place], [place], [address] Phone: [phone]   Fax:  [phone]  Name: Lindsay [facility] MRN: [id number] Date of Birth: May 17, 1950

## 2017-04-21 ENCOUNTER — Encounter: Payer: Self-pay | Admitting: Physical Therapy

## 2017-04-21 ENCOUNTER — Ambulatory Visit: Payer: Medicare Other | Admitting: Physical Therapy

## 2017-04-21 DIAGNOSIS — R29898 Other symptoms and signs involving the musculoskeletal system: Secondary | ICD-10-CM

## 2017-04-21 DIAGNOSIS — R262 Difficulty in walking, not elsewhere classified: Secondary | ICD-10-CM

## 2017-04-21 DIAGNOSIS — M25561 Pain in right knee: Secondary | ICD-10-CM | POA: Diagnosis not present

## 2017-04-21 NOTE — Therapy (Signed)
[facility] [address]  [place], [place], [address] Phone: [phone]   Fax:  [phone]  Physical Therapy Treatment  Patient Details  Name: SMT LOKEY MRN: [id number] Date of Birth: [birth year] Referring Provider: Erskine Emery, PA   Encounter Date: 04/21/2017  PT End of Session - 04/21/17 0758    Visit Number  4    Number of Visits  12    Date for PT Re-Evaluation  05/20/17    Authorization Type  Medicare    PT Start Time  0757    PT Stop Time  0836    PT Time Calculation (min)  39 min    Activity Tolerance  Patient tolerated treatment well    Behavior During Therapy  [facility] for tasks assessed/performed       Past Medical History:  Diagnosis Date  . Allergy    seasonal  . Bladder cancer ([place])   . Hyperlipidemia   . Wears glasses     Past Surgical History:  Procedure Laterality Date  . CESAREAN SECTION  X3  LAST ONE 1976   LAST ONE W/ TUBAL LIGATION  . COLONOSCOPY  2006    in [place]   . CYSTOSCOPY W/ URETERAL STENT PLACEMENT Bilateral 01/17/2013   Procedure: CYSTOSCOPY WITH BILATERAL RETROGRADE PYELOGRAM/ LEFT URETERAL STENT PLACEMENT;  Surgeon: Alexis Frock, MD;  Location: [facility];  Service: Urology;  Laterality: Bilateral;  . TONSILLECTOMY  AGE 67'S  . TRANSURETHRAL RESECTION OF BLADDER TUMOR N/A 01/17/2013   Procedure: TRANSURETHRAL RESECTION OF BLADDER TUMOR (TURBT);  Surgeon: Alexis Frock, MD;  Location: [facility];  Service: Urology;  Laterality: N/A;  . TUBAL LIGATION      There were no vitals filed for this visit.  Subjective Assessment - 04/21/17 0759    Subjective  feeling stiff this morning, ionto patch felt wekk - no OTC pain medication for 2-3 days after patch    Patient Stated Goals  improve pain - return to walking daily    Currently in Pain?  No/denies    Pain Score  0-No pain                      [facility] Adult PT  Treatment/Exercise - 04/21/17 0801      Knee/Hip Exercises: Aerobic   Recumbent Bike  L3 x 6      Knee/Hip Exercises: Machines for Strengthening   Cybex Knee Extension  B LE - 10# x 10; B con/R ecc - 10# x 10    Cybex Knee Flexion  B LE - 15# x 10; B con/R ecc - 15# x 10      Knee/Hip Exercises: Standing   Lateral Step Up  Right;15 reps;Hand Hold: 0;Step Height: 8"    Forward Step Up  Right;15 reps;Hand Hold: 0;Step Height: 8"    Step Down  Right;15 reps;Hand Hold: 2;Step Height: 4" eccentric - compensations at hip    Functional Squat  15 reps TRX    Other Standing Knee Exercises  fwd/bwd on AirEx x 15    Other Standing Knee Exercises  B side stepping - red tband x 25 feet each direction                  PT Long Term Goals - 04/14/17 0804      PT LONG TERM GOAL #1   Title  patient to be independent with advanced HEP    Status  On-going      PT LONG TERM GOAL #2   Title  patient to demonstrate good heel toe gait pattern on various surfaces    Status  On-going      PT LONG TERM GOAL #3   Title  patient to demonstrate stair navigation with reciprocal gait pattern with no evidence of instability    Status  On-going      PT LONG TERM GOAL #4   Title  patient to improve R LE strength to >/= 4+/5    Status  On-going      PT LONG TERM GOAL #5   Title  patient to report reduction in pain by >/= 75% for greater than 2 weeks    Status  On-going            Plan - 04/21/17 0801    Clinical Impression Statement  patient with concerns over stair navigation today. Therefore, time spent on forward and lateral step ups as well as step downs. Tolerable to all strength progressions with no issue. Patient with good relief from ionto patch at last visit, but would like to wait until Monday for another patch. Will continue to progress towards goals.     PT Treatment/Interventions  ADLs/Self Care Home Management;Cryotherapy;Electrical Stimulation;Iontophoresis 4mg /ml  Dexamethasone;Moist Heat;Therapeutic exercise;Therapeutic activities;Functional mobility training;Stair training;Gait training;Ultrasound;Balance training;Neuromuscular re-education;Manual techniques;Vasopneumatic Device;Taping;Dry needling;Passive range of motion    Consulted and Agree with Plan of Care  Patient       Patient will benefit from skilled therapeutic intervention in order to improve the following deficits and impairments:  Abnormal gait, Decreased activity tolerance, Decreased range of motion, Decreased mobility, Difficulty walking, Pain, Decreased strength  Visit Diagnosis: Acute pain of right knee  Other symptoms and signs involving the musculoskeletal system  Difficulty in walking, not elsewhere classified     Problem List Patient Active Problem List   Diagnosis Date Noted  . Status post arthroscopy of right knee 02/24/2017  . Acute pain of right knee 12/30/2016     Lanney Gins, PT, DPT 04/21/17 8:42 AM   [facility] [address]  [place], [place], [address] Phone: [phone]   Fax:  [phone]  Name: KAIJAH ABTS MRN: [id number] Date of Birth: [birth year]

## 2017-04-25 ENCOUNTER — Encounter: Payer: Self-pay | Admitting: Physical Therapy

## 2017-04-25 ENCOUNTER — Ambulatory Visit: Payer: Medicare Other | Admitting: Physical Therapy

## 2017-04-25 DIAGNOSIS — R29898 Other symptoms and signs involving the musculoskeletal system: Secondary | ICD-10-CM

## 2017-04-25 DIAGNOSIS — M25561 Pain in right knee: Secondary | ICD-10-CM | POA: Diagnosis not present

## 2017-04-25 DIAGNOSIS — R262 Difficulty in walking, not elsewhere classified: Secondary | ICD-10-CM

## 2017-04-25 NOTE — Therapy (Signed)
[facility] [address]  [place], [place], [address] Phone: [phone]   Fax:  [phone]  Physical Therapy Treatment  Patient Details  Name: Lindsay Burch MRN: [id number] Date of Birth: 1950/05/31 Referring Provider: Erskine Emery, PA   Encounter Date: 04/25/2017  PT End of Session - 04/25/17 0801    Visit Number  5    Number of Visits  12    Date for PT Re-Evaluation  05/20/17    Authorization Type  Medicare    PT Start Time  0755    PT Stop Time  0840    PT Time Calculation (min)  45 min    Activity Tolerance  Patient tolerated treatment well    Behavior During Therapy  [facility] for tasks assessed/performed       Past Medical History:  Diagnosis Date  . Allergy    seasonal  . Bladder cancer ([place])   . Hyperlipidemia   . Wears glasses     Past Surgical History:  Procedure Laterality Date  . CESAREAN SECTION  X3  LAST ONE 1976   LAST ONE W/ TUBAL LIGATION  . COLONOSCOPY  2006    in [place]   . CYSTOSCOPY W/ URETERAL STENT PLACEMENT Bilateral 01/17/2013   Procedure: CYSTOSCOPY WITH BILATERAL RETROGRADE PYELOGRAM/ LEFT URETERAL STENT PLACEMENT;  Surgeon: Alexis Frock, MD;  Location: [facility];  Service: Urology;  Laterality: Bilateral;  . TONSILLECTOMY  AGE 60'S  . TRANSURETHRAL RESECTION OF BLADDER TUMOR N/A 01/17/2013   Procedure: TRANSURETHRAL RESECTION OF BLADDER TUMOR (TURBT);  Surgeon: Alexis Frock, MD;  Location: [facility];  Service: Urology;  Laterality: N/A;  . TUBAL LIGATION      There were no vitals filed for this visit.  Subjective Assessment - 04/25/17 0800    Subjective  walked 1 mile on Saturday - hilly - had a good bit of pain that evening - pain resolution on Sunday    Patient Stated Goals  improve pain - return to walking daily    Currently in Pain?  Yes    Pain Score  1     Pain Location  Knee    Pain Orientation  Right    Pain Descriptors /  Indicators  Discomfort    Pain Type  Acute pain;Surgical pain                      [facility] Adult PT Treatment/Exercise - 04/25/17 0802      Knee/Hip Exercises: Stretches   Quad Stretch  Right;3 reps;30 seconds prone with strap    Gastroc Stretch  Right;3 reps;30 seconds prostretch      Knee/Hip Exercises: Aerobic   Recumbent Bike  L3 x 6 min      Knee/Hip Exercises: Machines for Strengthening   Cybex Knee Extension  B con/R ecc - 10# x 12    Cybex Knee Flexion  B con/R ecc - 15# x 12      Knee/Hip Exercises: Standing   Step Down  Right;15 reps;Hand Hold: 2;Step Height: 6" eccentric control + heel tap    Wall Squat  15 reps;3 seconds      Knee/Hip Exercises: Seated   Long Arc Quad  Strengthening;Right;15 reps;Weights + ball squeeze    Long Arc Quad Weight  3 lbs.      Knee/Hip Exercises: Supine   Other Supine Knee/Hip Exercises  HS bridge x 15 reps; HS birdge + HS curl  x 12 reps      Modalities   Modalities  Iontophoresis      Iontophoresis   Type of Iontophoresis  Dexamethasone    Location  R anterior/medial knee    Dose  1.0 mL    Time  80 mA; 4-6 hours                  PT Long Term Goals - 04/14/17 0804      PT LONG TERM GOAL #1   Title  patient to be independent with advanced HEP    Status  On-going      PT LONG TERM GOAL #2   Title  patient to demonstrate good heel toe gait pattern on various surfaces    Status  On-going      PT LONG TERM GOAL #3   Title  patient to demonstrate stair navigation with reciprocal gait pattern with no evidence of instability    Status  On-going      PT LONG TERM GOAL #4   Title  patient to improve R LE strength to >/= 4+/5    Status  On-going      PT LONG TERM GOAL #5   Title  patient to report reduction in pain by >/= 75% for greater than 2 weeks    Status  On-going            Plan - 04/25/17 0801    Clinical Impression Statement  Patient reporting initiating home walking program over the  weekend - up to 1 mile and hilly with resultant increased knee pain - discussed need to start on level groun with short distances and work towards more resistance/hills/time/distance with good understanding. Continued deficits noted in end range quad strength and eccentric quad strength as noted in step down with hip compensations + limited flexion depth. Will continue to progress towards goals.     PT Treatment/Interventions  ADLs/Self Care Home Management;Cryotherapy;Electrical Stimulation;Iontophoresis 4mg /ml Dexamethasone;Moist Heat;Therapeutic exercise;Therapeutic activities;Functional mobility training;Stair training;Gait training;Ultrasound;Balance training;Neuromuscular re-education;Manual techniques;Vasopneumatic Device;Taping;Dry needling;Passive range of motion    Consulted and Agree with Plan of Care  Patient       Patient will benefit from skilled therapeutic intervention in order to improve the following deficits and impairments:  Abnormal gait, Decreased activity tolerance, Decreased range of motion, Decreased mobility, Difficulty walking, Pain, Decreased strength  Visit Diagnosis: Acute pain of right knee  Other symptoms and signs involving the musculoskeletal system  Difficulty in walking, not elsewhere classified     Problem List Patient Active Problem List   Diagnosis Date Noted  . Status post arthroscopy of right knee 02/24/2017  . Acute pain of right knee 12/30/2016   Lanney Gins, PT, DPT 04/25/17 8:49 AM   [facility] [address]  [place], [place], [address] Phone: [phone]   Fax:  [phone]  Name: Lindsay Burch MRN: [id number] Date of Birth: 07/12/1950

## 2017-04-25 NOTE — Patient Instructions (Signed)
KNEE: Quadriceps - Prone   Place strap around ankle. Bring ankle toward buttocks. Press hip into surface. Hold _30__ seconds. __3_ reps per set  Knee Extension: Step-Down Forward / Sideways / Backward (Eccentric)   Stand, holding support, affected foot on step. Slowly bend affected knee for 3-5 seconds and bring other heel forward to floor.  Straighten affected leg. Repeat, placing foot flat to side. Repeat, touching toe behind. _15__ reps per set.

## 2017-04-28 ENCOUNTER — Ambulatory Visit: Payer: Medicare Other

## 2017-04-28 DIAGNOSIS — R29898 Other symptoms and signs involving the musculoskeletal system: Secondary | ICD-10-CM | POA: Diagnosis not present

## 2017-04-28 DIAGNOSIS — R262 Difficulty in walking, not elsewhere classified: Secondary | ICD-10-CM

## 2017-04-28 DIAGNOSIS — M25561 Pain in right knee: Secondary | ICD-10-CM

## 2017-04-28 NOTE — Therapy (Addendum)
[facility] [address]  [place], [place], [address] Phone: [phone]   Fax:  [phone]  Physical Therapy Treatment  Patient Details  Name: Lindsay Burch MRN: [id number] Date of Birth: 03-May-1950 Referring Provider: Erskine Emery, PA   Encounter Date: 04/28/2017  PT End of Session - 04/28/17 0800    Visit Number  6    Number of Visits  12    Date for PT Re-Evaluation  05/20/17    Authorization Type  Medicare    PT Start Time  0758    PT Stop Time  0839    PT Time Calculation (min)  41 min    Activity Tolerance  Patient tolerated treatment well    Behavior During Therapy  [facility] for tasks assessed/performed       Past Medical History:  Diagnosis Date  . Allergy    seasonal  . Bladder cancer ([place])   . Hyperlipidemia   . Wears glasses     Past Surgical History:  Procedure Laterality Date  . CESAREAN SECTION  X3  LAST ONE 1976   LAST ONE W/ TUBAL LIGATION  . COLONOSCOPY  2006    in [place]   . CYSTOSCOPY W/ URETERAL STENT PLACEMENT Bilateral 01/17/2013   Procedure: CYSTOSCOPY WITH BILATERAL RETROGRADE PYELOGRAM/ LEFT URETERAL STENT PLACEMENT;  Surgeon: Alexis Frock, MD;  Location: [facility];  Service: Urology;  Laterality: Bilateral;  . TONSILLECTOMY  AGE 89'S  . TRANSURETHRAL RESECTION OF BLADDER TUMOR N/A 01/17/2013   Procedure: TRANSURETHRAL RESECTION OF BLADDER TUMOR (TURBT);  Surgeon: Alexis Frock, MD;  Location: [facility];  Service: Urology;  Laterality: N/A;  . TUBAL LIGATION      There were no vitals filed for this visit.  Subjective Assessment - 04/28/17 0803    Subjective  Pt. reporting some difficulty with step-down activity at home.  Doing well otherwise.      Patient Stated Goals  improve pain - return to walking daily    Currently in Pain?  Yes    Pain Score  2     Pain Location  Knee    Pain Orientation  Right    Pain Descriptors / Indicators  --  "stiffness"    Pain Type  Acute pain;Surgical pain    Pain Onset  More than a month ago    Pain Frequency  Intermittent    Multiple Pain Sites  No                      [facility] Adult PT Treatment/Exercise - 04/28/17 0801      Knee/Hip Exercises: Aerobic   Recumbent Bike  L3 x 6 min      Knee/Hip Exercises: Machines for Strengthening   Cybex Knee Extension  B LE's 20# x 10 reps; B con/R ecc 15# x 10 reps     Cybex Knee Flexion  B LE's 25# x 10 reps; B con/R ecc 15# x 15 reps       Knee/Hip Exercises: Standing   Knee Flexion  Right;Left;10 reps    Knee Flexion Limitations  2#     Hip Flexion  Right;Left;10 reps;Knee straight    Hip Flexion Limitations  2#     Hip Abduction  Right;Left;10 reps;Knee straight    Abduction Limitations  2#     Hip Extension  Right;Left;10 reps;Knee straight    Extension Limitations  2#     Forward Step  Up  Right;15 reps;Hand Hold: 0;Step Height: 8"    Forward Step Up Limitations  1 ski pole     Wall Squat  15 reps;3 seconds    SLS  R SLS 2 x 15 sec hold; intermittent counter support       Knee/Hip Exercises: Supine   Straight Leg Raises  Strengthening;Right;15 reps 3#     Straight Leg Raise with External Rotation  Strengthening;Right;15 reps 3#       Iontophoresis   Type of Iontophoresis  Dexamethasone    Location  R anterior/medial knee    Dose  1.0 mL    Time  80 mA; 4-6 hours                  PT Long Term Goals - 04/14/17 0804      PT LONG TERM GOAL #1   Title  patient to be independent with advanced HEP    Status  On-going      PT LONG TERM GOAL #2   Title  patient to demonstrate good heel toe gait pattern on various surfaces    Status  On-going      PT LONG TERM GOAL #3   Title  patient to demonstrate stair navigation with reciprocal gait pattern with no evidence of instability    Status  On-going      PT LONG TERM GOAL #4   Title  patient to improve R LE strength to >/= 4+/5    Status  On-going      PT  LONG TERM GOAL #5   Title  patient to report reduction in pain by >/= 75% for greater than 2 weeks    Status  On-going            Plan - 04/28/17 6979    Clinical Impression Statement  Lindsay Burch reporting some difficulty with step-down HEP however revealed that she was using ~ 12" step for this activity.  Encouraged pt. to use lower step for this activity.  Lindsay Burch tolerated progression of machine and supine level strengthening activities well today focused on TKE strengthening.  Did well with addition of standing 2# HS curl, and initiation of SLS activities.  Progressing well toward goals.    PT Treatment/Interventions  ADLs/Self Care Home Management;Cryotherapy;Electrical Stimulation;Iontophoresis 4mg /ml Dexamethasone;Moist Heat;Therapeutic exercise;Therapeutic activities;Functional mobility training;Stair training;Gait training;Ultrasound;Balance training;Neuromuscular re-education;Manual techniques;Vasopneumatic Device;Taping;Dry needling;Passive range of motion    Consulted and Agree with Plan of Care  Patient       Patient will benefit from skilled therapeutic intervention in order to improve the following deficits and impairments:  Abnormal gait, Decreased activity tolerance, Decreased range of motion, Decreased mobility, Difficulty walking, Pain, Decreased strength  Visit Diagnosis: Acute pain of right knee  Other symptoms and signs involving the musculoskeletal system  Difficulty in walking, not elsewhere classified     Problem List Patient Active Problem List   Diagnosis Date Noted  . Status post arthroscopy of right knee 02/24/2017  . Acute pain of right knee 12/30/2016     Bess Harvest, PTA 04/29/17 10:51 AM  [facility] [address]  [place], [place], [address] Phone: [phone]   Fax:  [phone]  Name: Lindsay Burch MRN: [id number] Date of Birth: [birth year]

## 2017-05-02 ENCOUNTER — Ambulatory Visit: Payer: Medicare Other

## 2017-05-02 DIAGNOSIS — M25561 Pain in right knee: Secondary | ICD-10-CM | POA: Diagnosis not present

## 2017-05-02 DIAGNOSIS — R29898 Other symptoms and signs involving the musculoskeletal system: Secondary | ICD-10-CM | POA: Diagnosis not present

## 2017-05-02 DIAGNOSIS — R262 Difficulty in walking, not elsewhere classified: Secondary | ICD-10-CM

## 2017-05-02 NOTE — Therapy (Signed)
[facility] [address]  [place], [place], [address] Phone: [phone]   Fax:  [phone]  Physical Therapy Treatment  Patient Details  Name: Lindsay Burch MRN: [id number] Date of Birth: 07-25-1950 Referring Provider: Erskine Emery, PA   Encounter Date: 05/02/2017  PT End of Session - 05/02/17 0801    Visit Number  7    Number of Visits  12    Date for PT Re-Evaluation  05/20/17    Authorization Type  Medicare    PT Start Time  0758    PT Stop Time  0846    PT Time Calculation (min)  48 min    Activity Tolerance  Patient tolerated treatment well    Behavior During Therapy  [facility] for tasks assessed/performed       Past Medical History:  Diagnosis Date  . Allergy    seasonal  . Bladder cancer ([place])   . Hyperlipidemia   . Wears glasses     Past Surgical History:  Procedure Laterality Date  . CESAREAN SECTION  X3  LAST ONE 1976   LAST ONE W/ TUBAL LIGATION  . COLONOSCOPY  2006    in [place]   . CYSTOSCOPY W/ URETERAL STENT PLACEMENT Bilateral 01/17/2013   Procedure: CYSTOSCOPY WITH BILATERAL RETROGRADE PYELOGRAM/ LEFT URETERAL STENT PLACEMENT;  Surgeon: Alexis Frock, MD;  Location: [facility];  Service: Urology;  Laterality: Bilateral;  . TONSILLECTOMY  AGE 85'S  . TRANSURETHRAL RESECTION OF BLADDER TUMOR N/A 01/17/2013   Procedure: TRANSURETHRAL RESECTION OF BLADDER TUMOR (TURBT);  Surgeon: Alexis Frock, MD;  Location: [facility];  Service: Urology;  Laterality: N/A;  . TUBAL LIGATION      There were no vitals filed for this visit.  Subjective Assessment - 05/02/17 0759    Subjective  Pt. reporting some R knee soreness over weekend with some benefit from ionto patch last visit.      Patient Stated Goals  improve pain - return to walking daily    Currently in Pain?  Yes    Pain Score  2     Pain Location  Knee    Pain Orientation  Right    Pain Descriptors /  Indicators  Aching;Constant;Discomfort    Pain Type  Acute pain;Surgical pain    Pain Onset  More than a month ago    Pain Frequency  Intermittent    Aggravating Factors   Walking     Multiple Pain Sites  No                      [facility] Adult PT Treatment/Exercise - 05/02/17 1324      Neuro Re-ed    Neuro Re-ed Details   Side stepping with red TB at ankles on blue foam balance beam x 5 laps  intermittent UE support on counter       Knee/Hip Exercises: Aerobic   Nustep  Lvl 5, 6 min       Knee/Hip Exercises: Machines for Strengthening   Cybex Leg Press  B LE's 20# x 15 reps; B con/R ecc 10# x 10 reps  required cueing to avoid "locking out" knees       Knee/Hip Exercises: Standing   Heel Raises  Both;15 reps    Heel Raises Limitations  at UBE    Terminal Knee Extension  Right;15 reps;Strengthening    Theraband Level (Terminal Knee Extension)  Level 4 (Blue)  Terminal Knee Extension Limitations  band in door    Functional Squat  15 reps    Functional Squat Limitations  TRX  cues required to prevent L wt. shift; mirror feedback     SLS  R SLS 2 x 20 sec hold; intermittent counter support     SLS with Vectors  R SLS 2 x 20 sec with opposite LE cone toe-touch; 1 ski pole       Knee/Hip Exercises: Seated   Hamstring Curl  Right;10 reps    Hamstring Limitations  green TB      Knee/Hip Exercises: Supine   Straight Leg Raises  Strengthening;Right;15 reps 3#     Straight Leg Raise with External Rotation  Strengthening;Right;15 reps 3#     Other Supine Knee/Hip Exercises  B bridge + HS curl with heels peanut p-ball x 15 reps       Iontophoresis   Type of Iontophoresis  Dexamethasone    Location  R anterior/medial knee    Dose  1.0 mL    Time  80 mA; 4-6 hours             PT Education - 05/02/17 0853    Education provided  Yes    Education Details  standing TKE with blue TB issued to pt.     Person(s) Educated  Patient    Methods   Explanation;Demonstration;Verbal cues;Handout    Comprehension  Verbalized understanding;Returned demonstration;Verbal cues required;Need further instruction          PT Long Term Goals - 04/14/17 0804      PT LONG TERM GOAL #1   Title  patient to be independent with advanced HEP    Status  On-going      PT LONG TERM GOAL #2   Title  patient to demonstrate good heel toe gait pattern on various surfaces    Status  On-going      PT LONG TERM GOAL #3   Title  patient to demonstrate stair navigation with reciprocal gait pattern with no evidence of instability    Status  On-going      PT LONG TERM GOAL #4   Title  patient to improve R LE strength to >/= 4+/5    Status  On-going      PT LONG TERM GOAL #5   Title  patient to report reduction in pain by >/= 75% for greater than 2 weeks    Status  On-going            Plan - 05/02/17 0802    Clinical Impression Statement  Pt. reporting R knee soreness Saturday and Sunday however got relief from ionto patch Friday.  Tolerated progression of supine and standing LE strengthening activities well today.  Performed well with mild progression of SLS activities.  Ended treatment with application of ionto patch #4/6 per pt. request.  Will monitor response and progress per pt. tolerance in coming visits.      PT Treatment/Interventions  ADLs/Self Care Home Management;Cryotherapy;Electrical Stimulation;Iontophoresis 4mg /ml Dexamethasone;Moist Heat;Therapeutic exercise;Therapeutic activities;Functional mobility training;Stair training;Gait training;Ultrasound;Balance training;Neuromuscular re-education;Manual techniques;Vasopneumatic Device;Taping;Dry needling;Passive range of motion    Consulted and Agree with Plan of Care  Patient       Patient will benefit from skilled therapeutic intervention in order to improve the following deficits and impairments:  Abnormal gait, Decreased activity tolerance, Decreased range of motion, Decreased  mobility, Difficulty walking, Pain, Decreased strength  Visit Diagnosis: Acute pain of right knee  Other symptoms and signs  involving the musculoskeletal system  Difficulty in walking, not elsewhere classified     Problem List Patient Active Problem List   Diagnosis Date Noted  . Status post arthroscopy of right knee 02/24/2017  . Acute pain of right knee 12/30/2016    Bess Harvest, PTA 05/02/17 9:02 AM  [facility] [address]  [place], [place], [address] Phone: [phone]   Fax:  [phone]  Name: AKAYA PROFFIT MRN: [id number] Date of Birth: 10/30/1950

## 2017-05-05 ENCOUNTER — Ambulatory Visit: Payer: Medicare Other | Admitting: Physical Therapy

## 2017-05-05 ENCOUNTER — Encounter: Payer: Self-pay | Admitting: Physical Therapy

## 2017-05-05 DIAGNOSIS — R29898 Other symptoms and signs involving the musculoskeletal system: Secondary | ICD-10-CM

## 2017-05-05 DIAGNOSIS — R262 Difficulty in walking, not elsewhere classified: Secondary | ICD-10-CM | POA: Diagnosis not present

## 2017-05-05 DIAGNOSIS — M25561 Pain in right knee: Secondary | ICD-10-CM

## 2017-05-05 NOTE — Therapy (Signed)
[facility] [address]  [place], [place], [address] Phone: [phone]   Fax:  [phone]  Physical Therapy Treatment  Patient Details  Name: Lindsay Burch MRN: [id number] Date of Birth: September 21, 1950 Referring Provider: Erskine Emery, PA   Encounter Date: 05/05/2017  PT End of Session - 05/05/17 0800    Visit Number  8    Number of Visits  12    Date for PT Re-Evaluation  05/20/17    Authorization Type  Medicare    PT Start Time  0757    PT Stop Time  0841    PT Time Calculation (min)  44 min    Activity Tolerance  Patient tolerated treatment well    Behavior During Therapy  [facility] for tasks assessed/performed       Past Medical History:  Diagnosis Date  . Allergy    seasonal  . Bladder cancer ([place])   . Hyperlipidemia   . Wears glasses     Past Surgical History:  Procedure Laterality Date  . CESAREAN SECTION  X3  LAST ONE 1976   LAST ONE W/ TUBAL LIGATION  . COLONOSCOPY  2006    in [place]   . CYSTOSCOPY W/ URETERAL STENT PLACEMENT Bilateral 01/17/2013   Procedure: CYSTOSCOPY WITH BILATERAL RETROGRADE PYELOGRAM/ LEFT URETERAL STENT PLACEMENT;  Surgeon: Lindsay Frock, MD;  Location: [facility];  Service: Urology;  Laterality: Bilateral;  . TONSILLECTOMY  AGE 36'S  . TRANSURETHRAL RESECTION OF BLADDER TUMOR N/A 01/17/2013   Procedure: TRANSURETHRAL RESECTION OF BLADDER TUMOR (TURBT);  Surgeon: Lindsay Frock, MD;  Location: [facility];  Service: Urology;  Laterality: N/A;  . TUBAL LIGATION      There were no vitals filed for this visit.  Subjective Assessment - 05/05/17 0800    Subjective  doing well - knee calmed down from last visit    Patient Stated Goals  improve pain - return to walking daily    Currently in Pain?  Yes    Pain Score  2     Pain Location  Knee    Pain Orientation  Right    Pain Descriptors / Indicators  Sore    Pain Type  Acute pain;Surgical pain                       [facility] Adult PT Treatment/Exercise - 05/05/17 0801      Knee/Hip Exercises: Aerobic   Nustep  L5 x 6 min      Knee/Hip Exercises: Machines for Strengthening   Cybex Knee Extension  10# - B con/R ecc x 15    Cybex Knee Flexion  20# - B con/R ecc x 15    Cybex Leg Press  20# - B LE x 15    Other Machine  leg press B calf raise - 20# x 15 reps      Knee/Hip Exercises: Standing   Terminal Knee Extension  Strengthening;Right;15 reps    Theraband Level (Terminal Knee Extension)  Level 4 (Blue)    Functional Squat  15 reps    Functional Squat Limitations  TRX     Wall Squat  15 reps;3 seconds orange pball       Knee/Hip Exercises: Seated   Long Arc Quad  Strengthening;Right;15 reps;Weights + ball squeeze    Long Arc Quad Weight  3 lbs.      Knee/Hip Exercises: Supine   Bridges  15 reps  alternating LAQ at top position    Straight Leg Raises  Strengthening;Right;15 reps 3#      Knee/Hip Exercises: Sidelying   Hip ABduction  Strengthening;Right;15 reps 3#    Hip ADduction  Strengthening;Right;15 reps 3#                  PT Long Term Goals - 04/14/17 0804      PT LONG TERM GOAL #1   Title  patient to be independent with advanced HEP    Status  On-going      PT LONG TERM GOAL #2   Title  patient to demonstrate good heel toe gait pattern on various surfaces    Status  On-going      PT LONG TERM GOAL #3   Title  patient to demonstrate stair navigation with reciprocal gait pattern with no evidence of instability    Status  On-going      PT LONG TERM GOAL #4   Title  patient to improve R LE strength to >/= 4+/5    Status  On-going      PT LONG TERM GOAL #5   Title  patient to report reduction in pain by >/= 75% for greater than 2 weeks    Status  On-going            Plan - 05/05/17 0944    Clinical Impression Statement  [name] doing well today - making good progress towards goals. Does continue to have some low level  soreness throughout her day as well as pain flare up with prolonged activity. Doing well with all strengthening work - does continue to tend to deviate towards L hip/knee with squatting requiring VC for form.     PT Treatment/Interventions  ADLs/Self Care Home Management;Cryotherapy;Electrical Stimulation;Iontophoresis 4mg /ml Dexamethasone;Moist Heat;Therapeutic exercise;Therapeutic activities;Functional mobility training;Stair training;Gait training;Ultrasound;Balance training;Neuromuscular re-education;Manual techniques;Vasopneumatic Device;Taping;Dry needling;Passive range of motion    Consulted and Agree with Plan of Care  Patient       Patient will benefit from skilled therapeutic intervention in order to improve the following deficits and impairments:  Abnormal gait, Decreased activity tolerance, Decreased range of motion, Decreased mobility, Difficulty walking, Pain, Decreased strength  Visit Diagnosis: Acute pain of right knee  Other symptoms and signs involving the musculoskeletal system  Difficulty in walking, not elsewhere classified     Problem List Patient Active Problem List   Diagnosis Date Noted  . Status post arthroscopy of right knee 02/24/2017  . Acute pain of right knee 12/30/2016     Lindsay Burch, PT, DPT 05/05/17 9:49 AM   [facility] [address]  [place], [place], [address] Phone: [phone]   Fax:  [phone]  Name: Lindsay Burch MRN: [id number] Date of Birth: 01/11/1951

## 2017-05-09 ENCOUNTER — Ambulatory Visit: Payer: Medicare Other

## 2017-05-09 DIAGNOSIS — R29898 Other symptoms and signs involving the musculoskeletal system: Secondary | ICD-10-CM | POA: Diagnosis not present

## 2017-05-09 DIAGNOSIS — M25561 Pain in right knee: Secondary | ICD-10-CM

## 2017-05-09 DIAGNOSIS — R262 Difficulty in walking, not elsewhere classified: Secondary | ICD-10-CM

## 2017-05-09 NOTE — Therapy (Signed)
[facility] [address]  [place], [place], [address] Phone: [phone]   Fax:  [phone]  Physical Therapy Treatment  Patient Details  Name: Lindsay Burch MRN: [id number] Date of Birth: [birth year] Referring Provider: Erskine Emery, PA   Encounter Date: 05/09/2017  PT End of Session - 05/09/17 0804    Visit Number  9    Number of Visits  12    Date for PT Re-Evaluation  05/20/17    Authorization Type  Medicare    PT Start Time  3299    PT Stop Time  0844    PT Time Calculation (min)  47 min    Activity Tolerance  Patient tolerated treatment well    Behavior During Therapy  [facility] for tasks assessed/performed       Past Medical History:  Diagnosis Date  . Allergy    seasonal  . Bladder cancer ([place])   . Hyperlipidemia   . Wears glasses     Past Surgical History:  Procedure Laterality Date  . CESAREAN SECTION  X3  LAST ONE 1976   LAST ONE W/ TUBAL LIGATION  . COLONOSCOPY  2006    in [place]   . CYSTOSCOPY W/ URETERAL STENT PLACEMENT Bilateral 01/17/2013   Procedure: CYSTOSCOPY WITH BILATERAL RETROGRADE PYELOGRAM/ LEFT URETERAL STENT PLACEMENT;  Surgeon: Alexis Frock, MD;  Location: [facility];  Service: Urology;  Laterality: Bilateral;  . TONSILLECTOMY  AGE 67'S  . TRANSURETHRAL RESECTION OF BLADDER TUMOR N/A 01/17/2013   Procedure: TRANSURETHRAL RESECTION OF BLADDER TUMOR (TURBT);  Surgeon: Alexis Frock, MD;  Location: [facility];  Service: Urology;  Laterality: N/A;  . TUBAL LIGATION      There were no vitals filed for this visit.  Subjective Assessment - 05/09/17 0759    Subjective  Reports increased R knee stiffness today.  Walked a lot on uneven surface yesterday going to funeral.      Patient Stated Goals  improve pain - return to walking daily    Currently in Pain?  Yes    Pain Score  1     Pain Location  Knee    Pain Orientation  Right    Pain Descriptors /  Indicators  Discomfort "Stiffness"    Pain Type  Acute pain;Surgical pain    Pain Onset  More than a month ago    Pain Frequency  Intermittent    Aggravating Factors   Proloned sitting, walking on uneven ground     Multiple Pain Sites  No                      [facility] Adult PT Treatment/Exercise - 05/09/17 0807      Knee/Hip Exercises: Aerobic   Recumbent Bike  L3 x 6 min      Knee/Hip Exercises: Machines for Strengthening   Cybex Leg Press  B LE's 25# x 15 reps; R only 10# x 10 reps       Knee/Hip Exercises: Standing   Hip Flexion  Right;Left;10 reps;Knee straight;Stengthening    Hip Flexion Limitations  red TB at ankle; 2 ski poles     Forward Lunges  Right;Left;10 reps;3 seconds cues required for proper wt.. shift     Forward Lunges Limitations  TRX    Hip ADduction  Right;Left;10 reps;Strengthening    Hip ADduction Limitations  red TB at ankle; 2 ski poles     Hip Abduction  Right;Left;10  reps;Knee straight;Stengthening    Abduction Limitations  red TB at ankle; 2 ski poles     Hip Extension  Right;Left;10 reps;Knee straight;Stengthening    Extension Limitations  red TB at ankle; 2 ski poles     Step Down  Step Height: 6";15 reps;Right;Hand Hold: 0    Step Down Limitations  Focusing on eccentric control     Other Standing Knee Exercises  B side stepping, monster walk - red tband x 30 feet each direction      Knee/Hip Exercises: Supine   Straight Leg Raises  Right;20 reps 3#      Iontophoresis   Type of Iontophoresis  Dexamethasone    Location  R anterior/medial knee    Dose  1.0 mL    Time  80 mA; 4-6 hours             PT Education - 05/09/17 0908    Education provided  Yes    Education Details  side stepping with red TB issued to pt.     Person(s) Educated  Patient    Methods  Explanation;Demonstration;Verbal cues;Handout    Comprehension  Verbalized understanding;Returned demonstration;Verbal cues required;Need further instruction           PT Long Term Goals - 04/14/17 0804      PT LONG TERM GOAL #1   Title  patient to be independent with advanced HEP    Status  On-going      PT LONG TERM GOAL #2   Title  patient to demonstrate good heel toe gait pattern on various surfaces    Status  On-going      PT LONG TERM GOAL #3   Title  patient to demonstrate stair navigation with reciprocal gait pattern with no evidence of instability    Status  On-going      PT LONG TERM GOAL #4   Title  patient to improve R LE strength to >/= 4+/5    Status  On-going      PT LONG TERM GOAL #5   Title  patient to report reduction in pain by >/= 75% for greater than 2 weeks    Status  On-going            Plan - 05/09/17 1050    Clinical Impression Statement  Manus Gunning reporting she walked a lot on uneven surface yesterday to funeral service and reports increased R knee "stiffness" today.  "Stiffness" improved as treatment progressed today.  Pt. tolerated addition of 4-way hip kicker, and progression of machine strengthening today well.  Most difficulty with eccentric quad control today with step-down activity.  Required some cueing with TRX lunge for proper wt. shift.  Feels her ability to climb stairs at home is improving.  Ended treatment with some discomfort at R medial knee thus applied ionto patch for hopeful pain relief.  Will continue to progress toward goals.      PT Treatment/Interventions  ADLs/Self Care Home Management;Cryotherapy;Electrical Stimulation;Iontophoresis 4mg /ml Dexamethasone;Moist Heat;Therapeutic exercise;Therapeutic activities;Functional mobility training;Stair training;Gait training;Ultrasound;Balance training;Neuromuscular re-education;Manual techniques;Vasopneumatic Device;Taping;Dry needling;Passive range of motion    Consulted and Agree with Plan of Care  Patient       Patient will benefit from skilled therapeutic intervention in order to improve the following deficits and impairments:  Abnormal gait,  Decreased activity tolerance, Decreased range of motion, Decreased mobility, Difficulty walking, Pain, Decreased strength  Visit Diagnosis: Acute pain of right knee  Other symptoms and signs involving the musculoskeletal system  Difficulty in walking,  not elsewhere classified     Problem List Patient Active Problem List   Diagnosis Date Noted  . Status post arthroscopy of right knee 02/24/2017  . Acute pain of right knee 12/30/2016    Bess Harvest, PTA 05/09/17 10:57 AM   [facility] [address]  [place], [place], [address] Phone: [phone]   Fax:  [phone]  Name: ALIZZON DIOGUARDI MRN: [id number] Date of Birth: 03/27/1950

## 2017-05-10 DIAGNOSIS — E669 Obesity, unspecified: Secondary | ICD-10-CM | POA: Diagnosis not present

## 2017-05-10 DIAGNOSIS — Z6834 Body mass index (BMI) 34.0-34.9, adult: Secondary | ICD-10-CM | POA: Diagnosis not present

## 2017-05-12 ENCOUNTER — Encounter: Payer: Self-pay | Admitting: Physical Therapy

## 2017-05-12 ENCOUNTER — Ambulatory Visit: Payer: Medicare Other | Admitting: Physical Therapy

## 2017-05-12 DIAGNOSIS — M25561 Pain in right knee: Secondary | ICD-10-CM | POA: Diagnosis not present

## 2017-05-12 DIAGNOSIS — R29898 Other symptoms and signs involving the musculoskeletal system: Secondary | ICD-10-CM | POA: Diagnosis not present

## 2017-05-12 DIAGNOSIS — R262 Difficulty in walking, not elsewhere classified: Secondary | ICD-10-CM

## 2017-05-12 NOTE — Therapy (Signed)
[facility] [address]  [place], [place], [address] Phone: [phone]   Fax:  [phone]  Physical Therapy Treatment  Patient Details  Name: Lindsay Burch MRN: [id number] Date of Birth: 12-May-1950 Referring Provider: Erskine Emery, Pa   Encounter Date: 05/12/2017  PT End of Session - 05/12/17 0757    Visit Number  10    Number of Visits  12    Date for PT Re-Evaluation  05/20/17    Authorization Type  Medicare    PT Start Time  0754    PT Stop Time  0838    PT Time Calculation (min)  44 min    Activity Tolerance  Patient tolerated treatment well    Behavior During Therapy  [facility] for tasks assessed/performed       Past Medical History:  Diagnosis Date  . Allergy    seasonal  . Bladder cancer ([place] [facility])   . Hyperlipidemia   . Wears glasses     Past Surgical History:  Procedure Laterality Date  . CESAREAN SECTION  X3  LAST ONE 1976   LAST ONE W/ TUBAL LIGATION  . COLONOSCOPY  2006    in [place]   . CYSTOSCOPY W/ URETERAL STENT PLACEMENT Bilateral 01/17/2013   Procedure: CYSTOSCOPY WITH BILATERAL RETROGRADE PYELOGRAM/ LEFT URETERAL STENT PLACEMENT;  Surgeon: Alexis Frock, MD;  Location: [facility];  Service: Urology;  Laterality: Bilateral;  . TONSILLECTOMY  AGE 70'S  . TRANSURETHRAL RESECTION OF BLADDER TUMOR N/A 01/17/2013   Procedure: TRANSURETHRAL RESECTION OF BLADDER TUMOR (TURBT);  Surgeon: Alexis Frock, MD;  Location: [facility];  Service: Urology;  Laterality: N/A;  . TUBAL LIGATION      There were no vitals filed for this visit.  Subjective Assessment - 05/12/17 0755    Subjective  Noticed pain/discomfort was easing off Tuesday evening - feeling well this morning; feels 68% improved    Patient Stated Goals  improve pain - return to walking daily    Currently in Pain?  No/denies    Pain Score  0-No pain         [facility] PT Assessment - 05/12/17 0001      Assessment   Medical Diagnosis  s/p R knee arthroscopy    Referring Provider  Erskine Emery, Pa    Next MD Visit  05/16/17      Observation/Other Assessments   Focus on Therapeutic Outcomes (FOTO)   Knee: 69 (31% limited)      ROM / Strength   AROM / PROM / Strength  AROM;Strength      AROM   AROM Assessment Site  Knee    Right/Left Knee  Right    Right Knee Extension  0    Right Knee Flexion  130      Strength   Strength Assessment Site  Hip;Knee    Right/Left Hip  Right;Left    Right Hip Flexion  4/5    Left Hip Flexion  4+/5    Right/Left Knee  Right;Left    Right Knee Flexion  4+/5    Right Knee Extension  5/5    Left Knee Flexion  5/5    Left Knee Extension  5/5                  [facility] Adult PT Treatment/Exercise - 05/12/17 0001      Knee/Hip Exercises: Aerobic   Recumbent Bike  L2 x 6 min  Knee/Hip Exercises: Machines for Strengthening   Cybex Knee Extension  15# B LE x 15; 10# B con/R ecc x 15      Knee/Hip Exercises: Standing   Wall Squat  15 reps;3 seconds      Knee/Hip Exercises: Seated   Other Seated Knee/Hip Exercises  Fitter - R LE - 1 blue/1 black x 15 reps    Hamstring Curl  Strengthening;Right;15 reps    Hamstring Limitations  green TB      Knee/Hip Exercises: Supine   Other Supine Knee/Hip Exercises  B bridge + HS curl with heels peanut p-ball x 15 reps                   PT Long Term Goals - 05/12/17 0800      PT LONG TERM GOAL #1   Title  patient to be independent with advanced HEP    Status  On-going      PT LONG TERM GOAL #2   Title  patient to demonstrate good heel toe gait pattern on various surfaces    Status  Achieved      PT LONG TERM GOAL #3   Title  patient to demonstrate stair navigation with reciprocal gait pattern with no evidence of instability    Status  Achieved      PT LONG TERM GOAL #4   Title  patient to improve R LE strength to >/= 4+/5    Status  On-going      PT LONG TERM GOAL #5   Title   patient to report reduction in pain by >/= 75% for greater than 2 weeks    Status  Achieved            Plan - 05/12/17 0838    Clinical Impression Statement  Manus Gunning doing well today - subjective reports of good improvement since starting PT. Clinically she has progressed strength, AROM, and gait mechanics. Still some apprehension with descending stairs, however, likely to continue to improve with further strength gains and overall practice. Patient to follow-up with MD next week.     PT Treatment/Interventions  ADLs/Self Care Home Management;Cryotherapy;Electrical Stimulation;Iontophoresis 4mg /ml Dexamethasone;Moist Heat;Therapeutic exercise;Therapeutic activities;Functional mobility training;Stair training;Gait training;Ultrasound;Balance training;Neuromuscular re-education;Manual techniques;Vasopneumatic Device;Taping;Dry needling;Passive range of motion    Consulted and Agree with Plan of Care  Patient       Patient will benefit from skilled therapeutic intervention in order to improve the following deficits and impairments:  Abnormal gait, Decreased activity tolerance, Decreased range of motion, Decreased mobility, Difficulty walking, Pain, Decreased strength  Visit Diagnosis: Acute pain of right knee  Other symptoms and signs involving the musculoskeletal system  Difficulty in walking, not elsewhere classified     Problem List Patient Active Problem List   Diagnosis Date Noted  . Status post arthroscopy of right knee 02/24/2017  . Acute pain of right knee 12/30/2016     Lanney Gins, PT, DPT 05/12/17 10:37 AM   [facility] [address]  [place], [place], [address] Phone: [phone]   Fax:  [phone]  Name: Lindsay [facility] MRN: [id number] Date of Birth: 01-20-1951

## 2017-05-16 ENCOUNTER — Ambulatory Visit (INDEPENDENT_AMBULATORY_CARE_PROVIDER_SITE_OTHER): Payer: Medicare Other | Admitting: Physician Assistant

## 2017-05-16 ENCOUNTER — Encounter (INDEPENDENT_AMBULATORY_CARE_PROVIDER_SITE_OTHER): Payer: Self-pay | Admitting: Physician Assistant

## 2017-05-16 VITALS — Ht 60.5 in | Wt 170.0 lb

## 2017-05-16 DIAGNOSIS — Z9889 Other specified postprocedural states: Secondary | ICD-10-CM

## 2017-05-16 NOTE — Progress Notes (Signed)
HPI: [name] returns now 11 weeks status post right knee arthroscopy with partial medial meniscectomy.  She is overall doing much better.  She states physical therapy definitely has helped with her knee pain.  She has 2 more visits with physical therapy.  She is unsure if the [place] helped at all with her knee pain.  She does state the Voltaren gel has helped in the past.  She is taking  Advil as needed.  Physical exam: Right knee full extension full flexion.  She has some mild tenderness over the medial joint line.  Port sites well-healed no signs of infection.  Calf supple nontender.  No instability valgus varus stressing right knee.  No effusion abnormal warmth erythema of the right  Impression: 11 weeks status post right knee arthroscopy with partial medial meniscectomy  Plan: This point time she will continue to work with physical therapy for the remaining 2 visits.  Discharge to home exercise program.  She may benefit from Voltaren gel in the future she will call and let her office know.  Otherwise she will follow-up as needed.  Questions encouraged and answered

## 2017-05-17 ENCOUNTER — Ambulatory Visit: Payer: Medicare Other | Attending: Physician Assistant

## 2017-05-17 DIAGNOSIS — R29898 Other symptoms and signs involving the musculoskeletal system: Secondary | ICD-10-CM | POA: Insufficient documentation

## 2017-05-17 DIAGNOSIS — R262 Difficulty in walking, not elsewhere classified: Secondary | ICD-10-CM | POA: Insufficient documentation

## 2017-05-17 DIAGNOSIS — M25561 Pain in right knee: Secondary | ICD-10-CM | POA: Insufficient documentation

## 2017-05-17 NOTE — Therapy (Signed)
[facility] [address]  [place], [place], [address] Phone: [phone]   Fax:  [phone]  Physical Therapy Treatment  Patient Details  Name: Lindsay Burch MRN: [id number] Date of Birth: October 19, 1950 Referring Provider: Erskine Emery, Pa   Encounter Date: 05/17/2017  PT End of Session - 05/17/17 0759    Visit Number  11    Number of Visits  12    Date for PT Re-Evaluation  05/20/17    Authorization Type  Medicare    PT Start Time  0758    PT Stop Time  0844    PT Time Calculation (min)  46 min    Activity Tolerance  Patient tolerated treatment well    Behavior During Therapy  [facility] for tasks assessed/performed       Past Medical History:  Diagnosis Date  . Allergy    seasonal  . Bladder cancer ([place])   . Hyperlipidemia   . Wears glasses     Past Surgical History:  Procedure Laterality Date  . CESAREAN SECTION  X3  LAST ONE 1976   LAST ONE W/ TUBAL LIGATION  . COLONOSCOPY  2006    in [place]   . CYSTOSCOPY W/ URETERAL STENT PLACEMENT Bilateral 01/17/2013   Procedure: CYSTOSCOPY WITH BILATERAL RETROGRADE PYELOGRAM/ LEFT URETERAL STENT PLACEMENT;  Surgeon: Alexis Frock, MD;  Location: [facility];  Service: Urology;  Laterality: Bilateral;  . TONSILLECTOMY  AGE 31'S  . TRANSURETHRAL RESECTION OF BLADDER TUMOR N/A 01/17/2013   Procedure: TRANSURETHRAL RESECTION OF BLADDER TUMOR (TURBT);  Surgeon: Alexis Frock, MD;  Location: [facility];  Service: Urology;  Laterality: N/A;  . TUBAL LIGATION      There were no vitals filed for this visit.  Subjective Assessment - 05/17/17 1224    Subjective  Pt. reporting MD wanting her to transition to home HEP program following last two visits with therapy.  Feeling well today.      Patient Stated Goals  improve pain - return to walking daily    Currently in Pain?  No/denies    Pain Score  0-No pain    Multiple Pain Sites  No                       [facility] Adult PT Treatment/Exercise - 05/17/17 0802      Self-Care   Self-Care  Other Self-Care Comments    Other Self-Care Comments   Comprehensive HEP review with band resistance updated and special instruction on which muscle group is targeted with each activity per pt. request; pt. verbalized understanding of this       Knee/Hip Exercises: Stretches   Passive Hamstring Stretch  Right;30 seconds    Passive Hamstring Stretch Limitations  seated  Review per pt. request; some cueing for technique required       Knee/Hip Exercises: Aerobic   Recumbent Bike  L2 x 6 min      Knee/Hip Exercises: Machines for Strengthening   Cybex Knee Extension  B LE's: 15# x 20     Cybex Knee Flexion  B LE's 25# x 20 reps       Knee/Hip Exercises: Standing   Heel Raises  Both;10 reps    Heel Raises Limitations  B con/ R ecc     Other Standing Knee Exercises  B side stepping, monster walk - red tband x 40 feet each direction      Knee/Hip  Exercises: Supine   Bridges with Diona Foley Squeeze  15 reps;Both;Strengthening    Straight Leg Raises  Right;20 reps 3#    Other Supine Knee/Hip Exercises  B bridge + HS curl with heels peanut p-ball x 15 reps       Iontophoresis   Type of Iontophoresis  Dexamethasone    Location  R anterior/medial knee    Dose  1.0 mL    Time  80 mA-min; 4-6 hours             PT Education - 05/17/17 1222    Education provided  Yes    Education Details  Seated HS stretch     Person(s) Educated  Patient    Methods  Explanation;Demonstration;Verbal cues;Handout    Comprehension  Verbalized understanding;Returned demonstration;Verbal cues required;Need further instruction          PT Long Term Goals - 05/12/17 0800      PT LONG TERM GOAL #1   Title  patient to be independent with advanced HEP    Status  On-going      PT LONG TERM GOAL #2   Title  patient to demonstrate good heel toe gait pattern on various surfaces    Status  Achieved       PT LONG TERM GOAL #3   Title  patient to demonstrate stair navigation with reciprocal gait pattern with no evidence of instability    Status  Achieved      PT LONG TERM GOAL #4   Title  patient to improve R LE strength to >/= 4+/5    Status  On-going      PT LONG TERM GOAL #5   Title  patient to report reduction in pain by >/= 75% for greater than 2 weeks    Status  Achieved            Plan - 05/17/17 0759    Clinical Impression Statement  Lindsay Burch reporting recent MD f/u went well.  Reports MD wants her to finish out last two visits of therapy, then transition to home program.  Pt. feels comfortable with this plan.  Treatment focusing on review of comprehensive HEP with pt. verbalizing understanding.  Pt. with plans to continues walking 2-3/week and performing HEP to maintain general fitness following therapy.  Tolerated all LE strengthening actives in treatment well today.  Will plan to perform final goal testing and transition pt. to home program at upcoming visit.    PT Treatment/Interventions  ADLs/Self Care Home Management;Cryotherapy;Electrical Stimulation;Iontophoresis 4mg /ml Dexamethasone;Moist Heat;Therapeutic exercise;Therapeutic activities;Functional mobility training;Stair training;Gait training;Ultrasound;Balance training;Neuromuscular re-education;Manual techniques;Vasopneumatic Device;Taping;Dry needling;Passive range of motion    Consulted and Agree with Plan of Care  Patient       Patient will benefit from skilled therapeutic intervention in order to improve the following deficits and impairments:  Abnormal gait, Decreased activity tolerance, Decreased range of motion, Decreased mobility, Difficulty walking, Pain, Decreased strength  Visit Diagnosis: Acute pain of right knee  Other symptoms and signs involving the musculoskeletal system  Difficulty in walking, not elsewhere classified     Problem List Patient Active Problem List   Diagnosis Date Noted  .  Status post arthroscopy of right knee 02/24/2017  . Acute pain of right knee 12/30/2016    Bess Harvest, PTA 05/17/17 12:31 PM  [facility] [address]  [place], [place], [address] Phone: [phone]   Fax:  [phone]  Name: Lindsay Burch MRN: [id number] Date  of Birth: 1950-12-31

## 2017-05-18 ENCOUNTER — Other Ambulatory Visit (INDEPENDENT_AMBULATORY_CARE_PROVIDER_SITE_OTHER): Payer: Self-pay

## 2017-05-18 ENCOUNTER — Telehealth (INDEPENDENT_AMBULATORY_CARE_PROVIDER_SITE_OTHER): Payer: Self-pay | Admitting: Physician Assistant

## 2017-05-18 MED ORDER — DICLOFENAC SODIUM 1 % TD GEL: 2.0000 g | Freq: Four times a day (QID) | TRANSDERMAL | 4 refills | Status: DC | PRN

## 2017-05-18 NOTE — Telephone Encounter (Signed)
Sent into her pharmacy

## 2017-05-18 NOTE — Telephone Encounter (Signed)
Patient called needing a Rx for (Voltaren Gel)   The number to contact patient is [phone]

## 2017-05-20 ENCOUNTER — Ambulatory Visit: Payer: Medicare Other

## 2017-05-20 DIAGNOSIS — R29898 Other symptoms and signs involving the musculoskeletal system: Secondary | ICD-10-CM

## 2017-05-20 DIAGNOSIS — M25561 Pain in right knee: Secondary | ICD-10-CM

## 2017-05-20 DIAGNOSIS — R262 Difficulty in walking, not elsewhere classified: Secondary | ICD-10-CM | POA: Diagnosis not present

## 2017-05-20 NOTE — Therapy (Addendum)
[facility] [address]  [place], [place], [address] Phone: [phone]   Fax:  [phone]  Physical Therapy Treatment  Patient Details  Name: Lindsay Burch MRN: [id number] Date of Birth: [birth year] Referring Provider: Erskine Emery, Pa   Encounter Date: 05/20/2017  PT End of Session - 05/20/17 0810    Visit Number  12    Number of Visits  12    Date for PT Re-Evaluation  05/20/17    Authorization Type  Medicare    PT Start Time  7035    PT Stop Time  0835    PT Time Calculation (min)  38 min    Activity Tolerance  Patient tolerated treatment well    Behavior During Therapy  [facility] for tasks assessed/performed       Past Medical History:  Diagnosis Date  . Allergy    seasonal  . Bladder cancer ([place])   . Hyperlipidemia   . Wears glasses     Past Surgical History:  Procedure Laterality Date  . CESAREAN SECTION  X3  LAST ONE 1976   LAST ONE W/ TUBAL LIGATION  . COLONOSCOPY  2006    in [place]   . CYSTOSCOPY W/ URETERAL STENT PLACEMENT Bilateral 01/17/2013   Procedure: CYSTOSCOPY WITH BILATERAL RETROGRADE PYELOGRAM/ LEFT URETERAL STENT PLACEMENT;  Surgeon: Alexis Frock, MD;  Location: [facility];  Service: Urology;  Laterality: Bilateral;  . TONSILLECTOMY  AGE 52'S  . TRANSURETHRAL RESECTION OF BLADDER TUMOR Burch/A 01/17/2013   Procedure: TRANSURETHRAL RESECTION OF BLADDER TUMOR (TURBT);  Surgeon: Alexis Frock, MD;  Location: [facility];  Service: Urology;  Laterality: Burch/A;  . TUBAL LIGATION      There were no vitals filed for this visit.  Subjective Assessment - 05/20/17 0806    Subjective  Pt. reporting she feels comfortable transitioning to home program following today.      Patient Stated Goals  improve pain - return to walking daily    Currently in Pain?  No/denies    Pain Score  0-No pain    Multiple Pain Sites  No         [facility] PT Assessment - 05/20/17 0811       Observation/Other Assessments   Focus on Therapeutic Outcomes (FOTO)   72% (28% limitation)      Strength   Strength Assessment Site  Hip;Knee    Right/Left Hip  Right;Left    Right Hip Flexion  4/5    Left Hip Flexion  4+/5    Right/Left Knee  Right;Left    Right Knee Flexion  4+/5    Right Knee Extension  5/5    Left Knee Flexion  5/5    Left Knee Extension  5/5                  [facility] Adult PT Treatment/Exercise - 05/20/17 0815      Self-Care   Self-Care  Other Self-Care Comments    Other Self-Care Comments   Reviewed comprehensive HEP and technique with prone quad stretch per pt. request       Knee/Hip Exercises: Aerobic   Recumbent Bike  L2 x 6 min      Knee/Hip Exercises: Machines for Strengthening   Cybex Knee Extension  B LE's: 15# x 20     Cybex Knee Flexion  B LE's 25# x 20 reps       Knee/Hip Exercises: Standing   Other  Standing Knee Exercises  B side stepping, monster walk - greee tband 2 x 20 feet each direction      Knee/Hip Exercises: Supine   Bridges  10 reps;Right    Bridges Limitations  with R SLR at top of movement and adduction ball squeeze      Knee/Hip Exercises: Sidelying   Hip ABduction  Strengthening;Right;15 reps 3# hold                   PT Long Term Goals - 05/20/17 2595      PT LONG TERM GOAL #1   Title  patient to be independent with advanced HEP    Status  Achieved      PT LONG TERM GOAL #2   Title  patient to demonstrate good heel toe gait pattern on various surfaces    Status  Achieved      PT LONG TERM GOAL #3   Title  patient to demonstrate stair navigation with reciprocal gait pattern with no evidence of instability    Status  Achieved      PT LONG TERM GOAL #4   Title  patient to improve R LE strength to >/= 4+/5    Status  Achieved      PT LONG TERM GOAL #5   Title  patient to report reduction in pain by >/= 75% for greater than 2 weeks    Status  Achieved            Plan - 05/20/17 0811     Clinical Impression Statement  Lindsay Burch doing well today reporting she feels comfortable transitioning to home program.  Able to meet all LTG's with testing today and verbalizing understanding of ongoing comprehensive HEP following clarification of prone quad stretch technique.  Supervising PT agreeing to d/c today with pt. in agreement with this.  Pt. not d/c from therapy.    PT Treatment/Interventions  ADLs/Self Care Home Management;Cryotherapy;Electrical Stimulation;Iontophoresis 69m/ml Dexamethasone;Moist Heat;Therapeutic exercise;Therapeutic activities;Functional mobility training;Stair training;Gait training;Ultrasound;Balance training;Neuromuscular re-education;Manual techniques;Vasopneumatic Device;Taping;Dry needling;Passive range of motion    PT Next Visit Plan  d/c     Consulted and Agree with Plan of Care  Patient       Patient will benefit from skilled therapeutic intervention in order to improve the following deficits and impairments:  Abnormal gait, Decreased activity tolerance, Decreased range of motion, Decreased mobility, Difficulty walking, Pain, Decreased strength  Visit Diagnosis: Acute pain of right knee  Other symptoms and signs involving the musculoskeletal system  Difficulty in walking, not elsewhere classified     Problem List Patient Active Problem List   Diagnosis Date Noted  . Status post arthroscopy of right knee 02/24/2017  . Acute pain of right knee 12/30/2016    MBess Harvest PTA 05/20/17 8:45 AM   PHYSICAL THERAPY DISCHARGE SUMMARY  Visits from Start of Care: 12  Current functional level related to goals / functional outcomes: See above   Remaining deficits: See above   Education / Equipment: HEP  Plan: Patient agrees to discharge.  Patient goals were met. Patient is being discharged due to meeting the stated rehab goals.  ?????   SLanney Gins PT, DPT 07/28/17 10:09 AM      [facility] [place] [address]Phone: [phone]  Fax:  [phone] Name: Lindsay VINLUANMRN: [id number] of Birth: [birth year]

## 2017-07-14 DIAGNOSIS — H30021 Focal chorioretinal inflammation of posterior pole, right eye: Secondary | ICD-10-CM | POA: Diagnosis not present

## 2017-07-14 DIAGNOSIS — H52223 Regular astigmatism, bilateral: Secondary | ICD-10-CM | POA: Diagnosis not present

## 2017-07-14 DIAGNOSIS — H25813 Combined forms of age-related cataract, bilateral: Secondary | ICD-10-CM | POA: Diagnosis not present

## 2017-07-14 DIAGNOSIS — H5203 Hypermetropia, bilateral: Secondary | ICD-10-CM | POA: Diagnosis not present

## 2017-07-14 DIAGNOSIS — H47293 Other optic atrophy, bilateral: Secondary | ICD-10-CM | POA: Diagnosis not present

## 2017-08-09 DIAGNOSIS — E669 Obesity, unspecified: Secondary | ICD-10-CM | POA: Diagnosis not present

## 2017-08-09 DIAGNOSIS — Z6832 Body mass index (BMI) 32.0-32.9, adult: Secondary | ICD-10-CM | POA: Diagnosis not present

## 2017-10-19 ENCOUNTER — Ambulatory Visit (INDEPENDENT_AMBULATORY_CARE_PROVIDER_SITE_OTHER): Payer: Medicare Other

## 2017-10-19 ENCOUNTER — Ambulatory Visit (INDEPENDENT_AMBULATORY_CARE_PROVIDER_SITE_OTHER): Payer: Medicare Other | Admitting: Physician Assistant

## 2017-10-19 ENCOUNTER — Encounter (INDEPENDENT_AMBULATORY_CARE_PROVIDER_SITE_OTHER): Payer: Self-pay | Admitting: Physician Assistant

## 2017-10-19 DIAGNOSIS — M1712 Unilateral primary osteoarthritis, left knee: Secondary | ICD-10-CM

## 2017-10-19 MED ORDER — METHYLPREDNISOLONE ACETATE 40 MG/ML IJ SUSP
40.0000 mg | INTRAMUSCULAR | Status: AC | PRN
  Administered 2017-10-19: 40 mg via INTRA_ARTICULAR

## 2017-10-19 MED ORDER — LIDOCAINE HCL 1 % IJ SOLN
3.0000 mL | INTRAMUSCULAR | Status: AC | PRN
  Administered 2017-10-19: 3 mL

## 2017-10-19 NOTE — Progress Notes (Signed)
Office Visit Note   Patient: Lindsay Burch           Date of Birth: 1950-03-31           MRN: [id number] Visit Date: 10/19/2017              Requested by: Marton Redwood, MD [address] [place], [place] [address] PCP: Marton Redwood, MD   Assessment & Plan: Visit Diagnoses:  1. 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  She can continue her Advil and Voltaren gel as needed.  Follow-up with [place] in 2 weeks check her response to the injection.  If she develops any mechanical symptoms would consider MRI to rule out meniscal tear.  However if she has no mechanical symptoms and continues to have pain without an effusion of the knee most likely she would benefit from supplemental injection of the knee.  Follow-Up Instructions: Return in about 2 weeks (around 11/02/2017).   Orders:  Orders Placed This Encounter  Procedures  . XR Knee 1-2 Views Left   No orders of the defined types were placed in this encounter.     Procedures: Large Joint Inj: L knee on 10/19/2017 11:43 AM Indications: pain Details: 22 G 1.5 in needle, anterolateral approach  Arthrogram: No  Medications: 3 mL lidocaine 1 %; 40 mg methylPREDNISolone acetate 40 MG/ML Outcome: tolerated well, no immediate complications Procedure, treatment alternatives, risks and benefits explained, specific risks discussed. Consent was given by the patient. Immediately prior to procedure a time out was called to verify the correct patient, procedure, equipment, support staff and site/side marked as required. Patient was prepped and draped in the usual sterile fashion.       Clinical Data: No additional findings.   Subjective: Chief Complaint  Patient presents with  . Left Knee - Pain    HPI Lindsay Burch is well-known to Dr. Ninfa Linden service comes in today due to left knee pain for 1 month.  She notes some swelling in the knee.  No mechanical symptoms.   Pain is worse whenever she walks throughout the day.  She is tried exercise with having increased pain in the knee and stiffness.  She is tried Advil and Voltaren gel without any real relief.  She does ice the knee night during the day at work.  She notes that she has significant pain and stiffness whenever she first stands after sitting and has to "stand there a minute".. Review of Systems See HPI otherwise negative  Objective: Vital Signs: There were no vitals taken for this visit.  Physical Exam  Constitutional: She is oriented to person, place, and time. She appears well-developed and well-nourished. No distress.  Pulmonary/Chest: Effort normal.  Neurological: She is alert and oriented to person, place, and time.  Skin: She is not diaphoretic.  Psychiatric: She has a normal mood and affect.    Ortho Exam Bilateral knees good range of motion.  No instability valgus varus stressing.  No abnormal warmth erythema ecchymosis or effusion.  Tenderness along the medial joint line of the left knee.  Negative McMurray's bilaterally. Specialty Comments:  No specialty comments available.  Imaging: Xr Knee 1-2 Views Left  Result Date: 10/19/2017 AP lateral views left knee: No acute fracture.  Knee is well located.  Moderate medial compartmental narrowing.  Moderate patellofemoral changes.  Lateral compartment well-preserved.    PMFS History: Patient Active Problem List  Diagnosis Date Noted  . Status post arthroscopy of right knee 02/24/2017  . Acute pain of right knee 12/30/2016   Past Medical History:  Diagnosis Date  . Allergy    seasonal  . Bladder cancer ([place])   . Hyperlipidemia   . Wears glasses     Family History  Problem Relation Age of Onset  . Congestive Heart Failure Mother        died 29 from this   . Heart disease Father 71       died from MI 19   . Colon cancer Neg Hx   . Colon polyps Neg Hx   . Esophageal cancer Neg Hx   . Rectal cancer Neg Hx   . Stomach  cancer Neg Hx     Past Surgical History:  Procedure Laterality Date  . CESAREAN SECTION  X3  LAST ONE 1976   LAST ONE W/ TUBAL LIGATION  . COLONOSCOPY  2006    in [place]   . CYSTOSCOPY W/ URETERAL STENT PLACEMENT Bilateral 01/17/2013   Procedure: CYSTOSCOPY WITH BILATERAL RETROGRADE PYELOGRAM/ LEFT URETERAL STENT PLACEMENT;  Surgeon: Alexis Frock, MD;  Location: [facility];  Service: Urology;  Laterality: Bilateral;  . TONSILLECTOMY  AGE 67'S  . TRANSURETHRAL RESECTION OF BLADDER TUMOR N/A 01/17/2013   Procedure: TRANSURETHRAL RESECTION OF BLADDER TUMOR (TURBT);  Surgeon: Alexis Frock, MD;  Location: [facility];  Service: Urology;  Laterality: N/A;  . TUBAL LIGATION     Social History   Occupational History  . Not on file  Tobacco Use  . Smoking status: Former Smoker    Packs/day: 1.00    Years: 16.00    Pack years: 16.00    Types: Cigarettes    Last attempt to quit: 01/11/1993    Years since quitting: 24.7  . Smokeless tobacco: Never Used  Substance and Sexual Activity  . Alcohol use: Yes    Alcohol/week: 0.0 oz    Comment: VERY RARE  . Drug use: No  . Sexual activity: Not on file

## 2017-11-07 ENCOUNTER — Ambulatory Visit (INDEPENDENT_AMBULATORY_CARE_PROVIDER_SITE_OTHER): Payer: Medicare Other | Admitting: Physician Assistant

## 2017-11-07 ENCOUNTER — Telehealth (INDEPENDENT_AMBULATORY_CARE_PROVIDER_SITE_OTHER): Payer: Self-pay

## 2017-11-07 ENCOUNTER — Encounter (INDEPENDENT_AMBULATORY_CARE_PROVIDER_SITE_OTHER): Payer: Self-pay | Admitting: Physician Assistant

## 2017-11-07 DIAGNOSIS — M1712 Unilateral primary osteoarthritis, left knee: Secondary | ICD-10-CM | POA: Diagnosis not present

## 2017-11-07 NOTE — Progress Notes (Signed)
Office Visit Note   Patient: Lindsay Burch           Date of Birth: Dec 15, 1950           MRN: [id number] Visit Date: 11/07/2017              Requested by: Marton Redwood, MD [address] [place], [place] [address] PCP: Marton Redwood, MD   Assessment & Plan: Visit Diagnoses:  1. Primary osteoarthritis of left knee     Plan:  We will have her go back to doing a home exercise program for quad strengthening as she is been physical therapy for her right knee in the past.  We will try to gain approval for supplemental injection for her knee.  Handout on hyaluronic acid injections given today.  She also is going to try Tumeric and stop the NSAIDs is upsetting her stomach.  She asked about her foot and she has some localized pain in the dorsal aspect of her right foot feels as if she has some early arthritic spurring in this area.  She will try some Voltaren gel to this area.  Follow-Up Instructions: Return for Supplemental injection.   Orders:  No orders of the defined types were placed in this encounter.  No orders of the defined types were placed in this encounter.     Procedures: No procedures performed   Clinical Data: No additional findings.   Subjective: Chief Complaint  Patient presents with  . Left Knee - Follow-up    HPI Lindsay Burch returns today status post left knee cortisone injection.  She is overall about 50% better.  She has had no real mechanical symptoms of the knee.  Still has good days bad days.  She states that the Aleve upsets her stomach.  She tried Voltaren gel without any real relief. Review of Systems Please see HPI otherwise negative  Objective: Vital Signs: There were no vitals taken for this visit.  Physical Exam  Constitutional: She is oriented to person, place, and time. She appears well-developed and well-nourished. No distress.  Pulmonary/Chest: Effort normal.  Neurological: She is alert and oriented to person, place, and time.    Skin: She is not diaphoretic.    Ortho Exam Left knee no effusion abnormal warmth erythema.  Patellofemoral crepitus with passive range of motion.  Tenderness medial joint line.  No instability valgus varus stressing.  McMurray's negative. Specialty Comments:  No specialty comments available.  Imaging: No results found.   PMFS History: Patient Active Problem List   Diagnosis Date Noted  . Primary osteoarthritis of left knee 11/07/2017  . Status post arthroscopy of right knee 02/24/2017  . Acute pain of right knee 12/30/2016   Past Medical History:  Diagnosis Date  . Allergy    seasonal  . Bladder cancer ([place])   . Hyperlipidemia   . Wears glasses     Family History  Problem Relation Age of Onset  . Congestive Heart Failure Mother        died 13 from this   . Heart disease Father 23       died from MI 55   . Colon cancer Neg Hx   . Colon polyps Neg Hx   . Esophageal cancer Neg Hx   . Rectal cancer Neg Hx   . Stomach cancer Neg Hx     Past Surgical History:  Procedure Laterality Date  . CESAREAN SECTION  X3  LAST ONE 1976   LAST ONE W/ TUBAL  LIGATION  . COLONOSCOPY  2006    in [place]   . CYSTOSCOPY W/ URETERAL STENT PLACEMENT Bilateral 01/17/2013   Procedure: CYSTOSCOPY WITH BILATERAL RETROGRADE PYELOGRAM/ LEFT URETERAL STENT PLACEMENT;  Surgeon: Alexis Frock, MD;  Location: [facility];  Service: Urology;  Laterality: Bilateral;  . TONSILLECTOMY  AGE 22'S  . TRANSURETHRAL RESECTION OF BLADDER TUMOR N/A 01/17/2013   Procedure: TRANSURETHRAL RESECTION OF BLADDER TUMOR (TURBT);  Surgeon: Alexis Frock, MD;  Location: [facility];  Service: Urology;  Laterality: N/A;  . TUBAL LIGATION     Social History   Occupational History  . Not on file  Tobacco Use  . Smoking status: Former Smoker    Packs/day: 1.00    Years: 16.00    Pack years: 16.00    Types: Cigarettes    Last attempt to quit: 01/11/1993    Years since quitting: 24.8  .  Smokeless tobacco: Never Used  Substance and Sexual Activity  . Alcohol use: Yes    Alcohol/week: 0.0 standard drinks    Comment: VERY RARE  . Drug use: No  . Sexual activity: Not on file

## 2017-11-07 NOTE — Telephone Encounter (Signed)
Can we get patient approved for left knee monovisc injection? Thanks It is Gil's patient.

## 2017-11-08 ENCOUNTER — Telehealth (INDEPENDENT_AMBULATORY_CARE_PROVIDER_SITE_OTHER): Payer: Self-pay

## 2017-11-08 NOTE — Telephone Encounter (Signed)
Noted  

## 2017-11-08 NOTE — Telephone Encounter (Signed)
Submitted VOB for Monovisc, left knee. 

## 2017-11-18 ENCOUNTER — Telehealth (INDEPENDENT_AMBULATORY_CARE_PROVIDER_SITE_OTHER): Payer: Self-pay

## 2017-11-18 NOTE — Telephone Encounter (Signed)
Patient is approved for Monovisc, left knee. Buy & Bill Covered at 100% of the allowed amount. No Co-pay No PA required  Appt.scheduled 12/07/2017

## 2017-11-28 DIAGNOSIS — E669 Obesity, unspecified: Secondary | ICD-10-CM | POA: Diagnosis not present

## 2017-11-28 DIAGNOSIS — M25562 Pain in left knee: Secondary | ICD-10-CM | POA: Diagnosis not present

## 2017-11-28 DIAGNOSIS — Z6832 Body mass index (BMI) 32.0-32.9, adult: Secondary | ICD-10-CM | POA: Diagnosis not present

## 2017-12-05 DIAGNOSIS — R05 Cough: Secondary | ICD-10-CM | POA: Diagnosis not present

## 2017-12-05 DIAGNOSIS — H6121 Impacted cerumen, right ear: Secondary | ICD-10-CM | POA: Diagnosis not present

## 2017-12-05 DIAGNOSIS — J069 Acute upper respiratory infection, unspecified: Secondary | ICD-10-CM | POA: Diagnosis not present

## 2017-12-05 DIAGNOSIS — H6691 Otitis media, unspecified, right ear: Secondary | ICD-10-CM | POA: Diagnosis not present

## 2017-12-05 DIAGNOSIS — J029 Acute pharyngitis, unspecified: Secondary | ICD-10-CM | POA: Diagnosis not present

## 2017-12-05 DIAGNOSIS — Z6833 Body mass index (BMI) 33.0-33.9, adult: Secondary | ICD-10-CM | POA: Diagnosis not present

## 2017-12-07 ENCOUNTER — Encounter (INDEPENDENT_AMBULATORY_CARE_PROVIDER_SITE_OTHER): Payer: Self-pay | Admitting: Orthopaedic Surgery

## 2017-12-07 ENCOUNTER — Ambulatory Visit (INDEPENDENT_AMBULATORY_CARE_PROVIDER_SITE_OTHER): Payer: Medicare Other | Admitting: Orthopaedic Surgery

## 2017-12-07 DIAGNOSIS — M1712 Unilateral primary osteoarthritis, left knee: Secondary | ICD-10-CM | POA: Diagnosis not present

## 2017-12-07 MED ORDER — HYALURONAN 88 MG/4ML IX SOSY
88.0000 mg | PREFILLED_SYRINGE | INTRA_ARTICULAR | Status: AC | PRN
  Administered 2017-12-07: 88 mg via INTRA_ARTICULAR

## 2017-12-07 NOTE — Progress Notes (Signed)
   Procedure Note  Patient: Lindsay Burch             Date of Birth: February 17, 1951           MRN: [id number]             Visit Date: 12/07/2017  Procedures: Visit Diagnoses: Primary osteoarthritis of left knee  Large Joint Inj: L knee on 12/07/2017 8:31 AM Indications: diagnostic evaluation and pain Details: 22 G 1.5 in needle, superolateral approach  Arthrogram: No  Medications: 88 mg Hyaluronan 88 MG/4ML Outcome: tolerated well, no immediate complications Procedure, treatment alternatives, risks and benefits explained, specific risks discussed. Consent was given by the patient. Immediately prior to procedure a time out was called to verify the correct patient, procedure, equipment, support staff and site/side marked as required. Patient was prepped and draped in the usual sterile fashion.    The patient is here today for scheduled hyaluronic acid injection with Monovisc in her left knee to treat the pain from known osteoarthritis.  She has had a successful steroid injection in her left knee.  This is next step for her and she understands the rationale behind injections like this as well as the risk benefits involved.  On examination of her left knee there is no effusion and she moves her knee well.  She does not have a significant amount of pain today.  The Monovisc injection was placed in the superior lateral aspect of her left knee today without difficulty.  She walked out of the office without any pain or limp.  All question concerns were answered and addressed.  Follow-up will be as needed.

## 2017-12-17 DIAGNOSIS — Z23 Encounter for immunization: Secondary | ICD-10-CM | POA: Diagnosis not present

## 2018-01-01 IMAGING — MR MR KNEE*R* W/O CM
5 of 6 series · 33 of 40 positions shown · non-contrast
Comparison: None.

CLINICAL DATA: Right knee pain and buckling for 6 weeks. No known
injury.

EXAM:
MRI OF THE RIGHT KNEE WITHOUT CONTRAST
TECHNIQUE: Multiplanar, multisequence MR imaging of the knee was performed. No
intravenous contrast was administered.

[Series 6: PD fat-sat · axial · right · 3.0mm · 0.39mm/px · z∈[-67,+48]mm · 8 of 36 slices shown (1 of 3)]
[im 1/36]
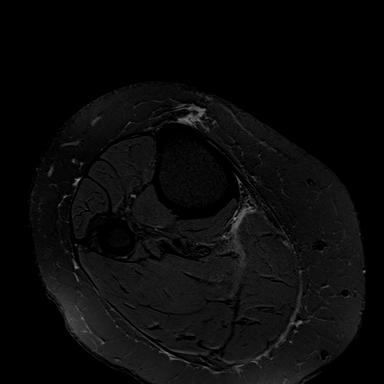
[im 6/36]
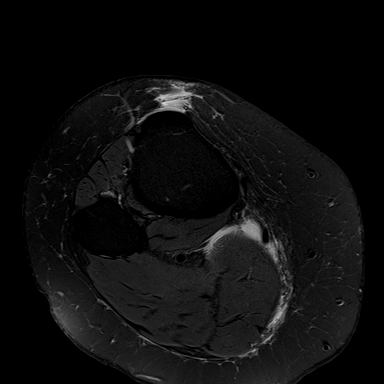
[im 11/36]
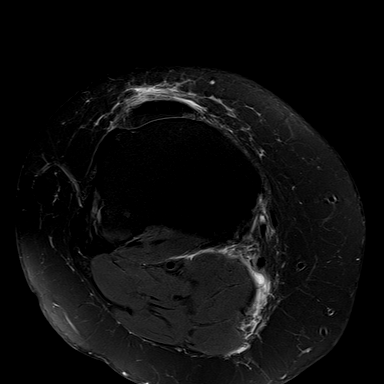
[im 16/36]
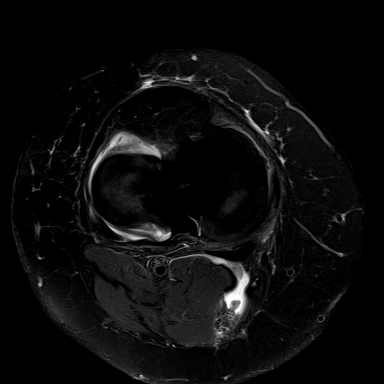
[im 21/36]
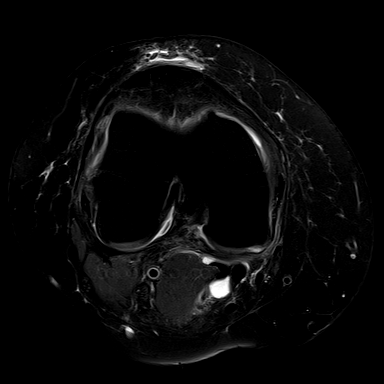
[im 26/36]
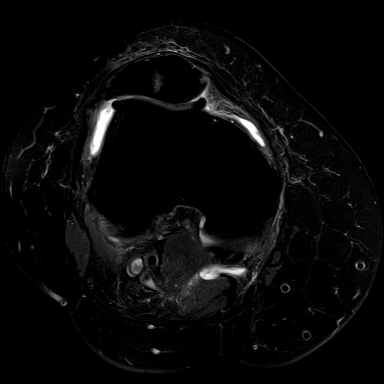
[im 31/36]
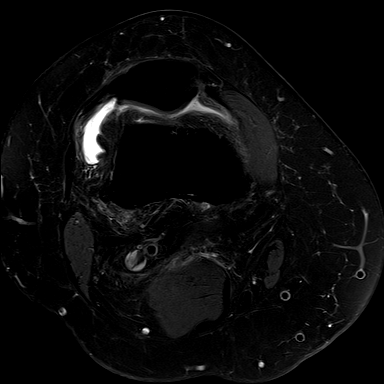
[im 36/36]
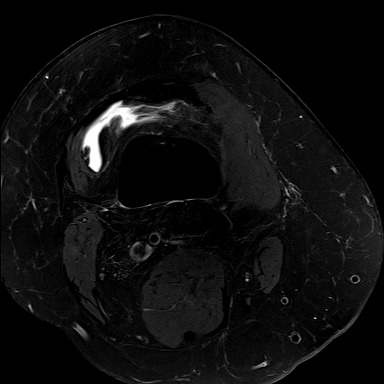

[Series 8: PD fat-sat · coronal · right · 3.0mm · 0.33mm/px · 7 of 33 slices shown (2 of 3)]
[im 1/33]
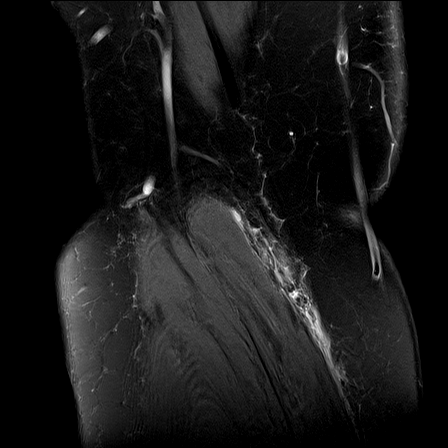
[im 6/33]
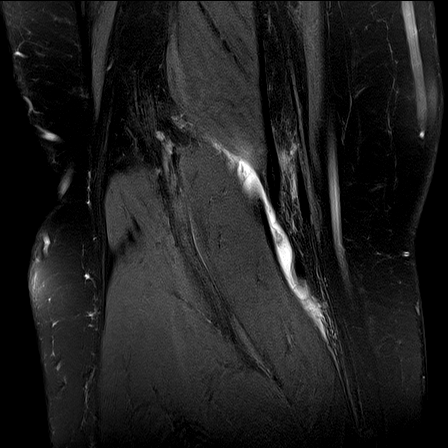
[im 11/33]
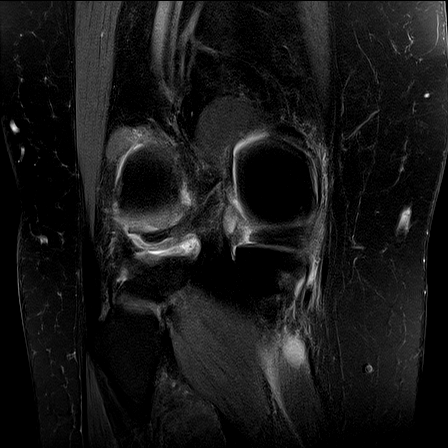
[im 17/33]
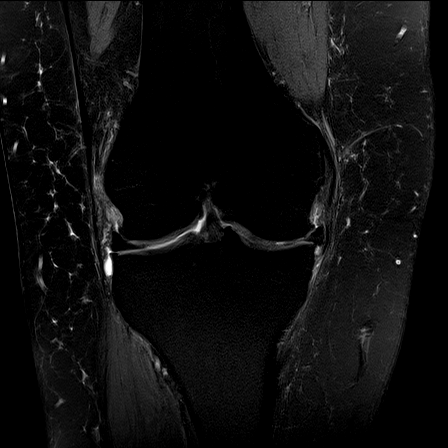
[im 22/33]
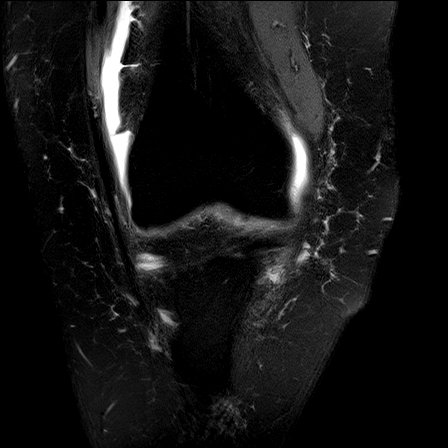
[im 27/33]
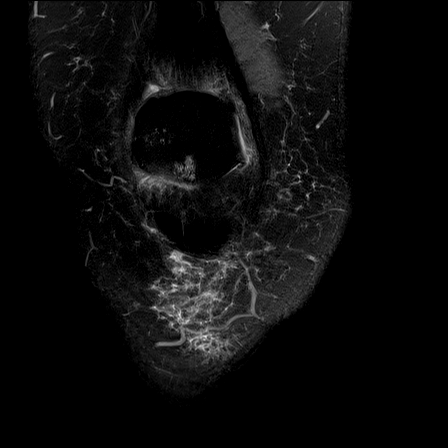
[im 33/33]
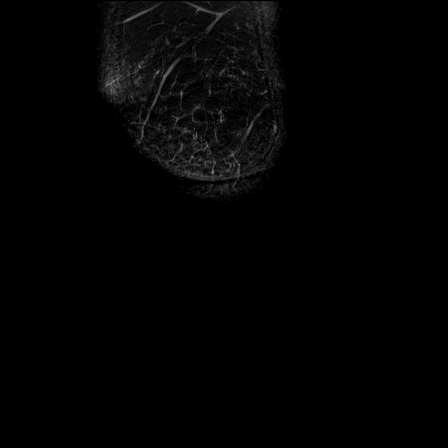

[Series 9: T2 fat-sat · coronal · right · 3.0mm · 0.39mm/px · 7 of 33 slices shown]
[im 1/33]
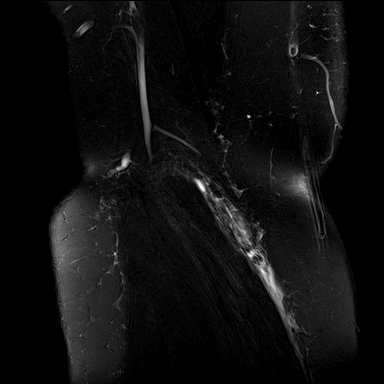
[im 6/33]
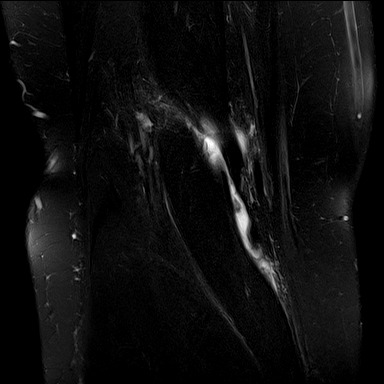
[im 11/33]
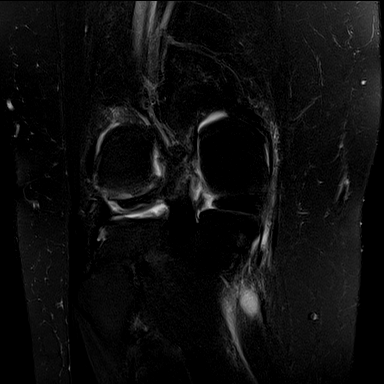
[im 17/33]
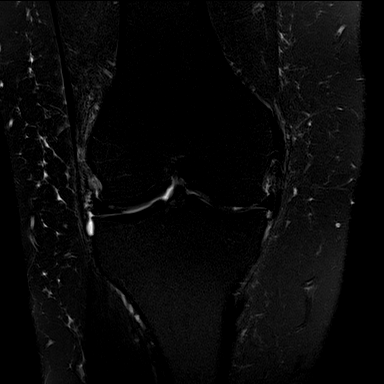
[im 22/33]
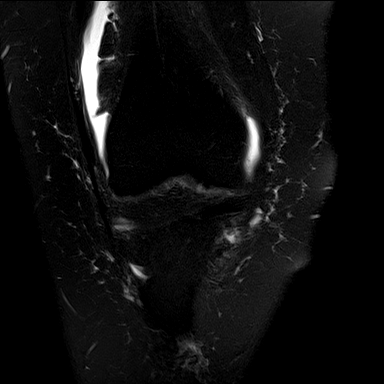
[im 27/33]
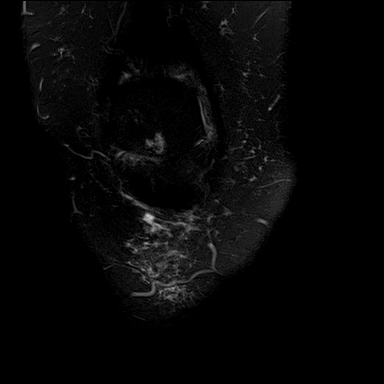
[im 33/33]
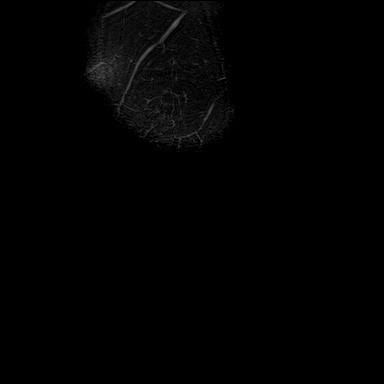

[Series 10: PD fat-sat · sagittal · right · 3.0mm · 0.39mm/px · 6 of 27 slices shown (3 of 3)]
[im 1/27]
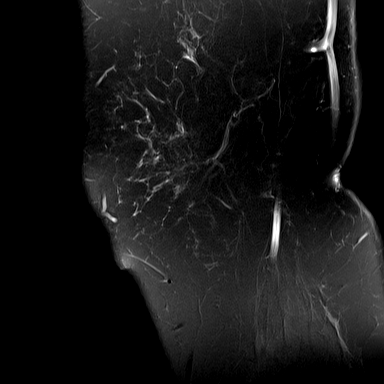
[im 6/27]
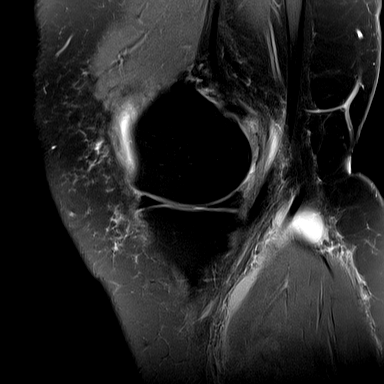
[im 11/27]
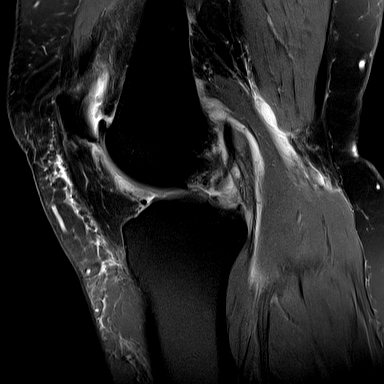
[im 16/27]
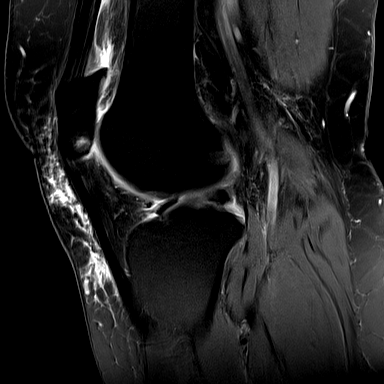
[im 21/27]
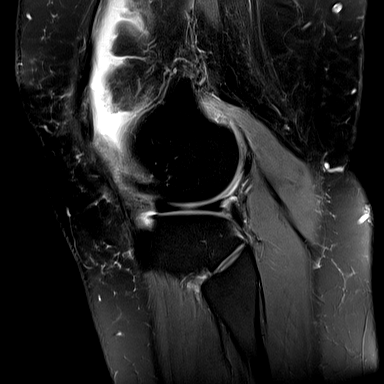
[im 27/27]
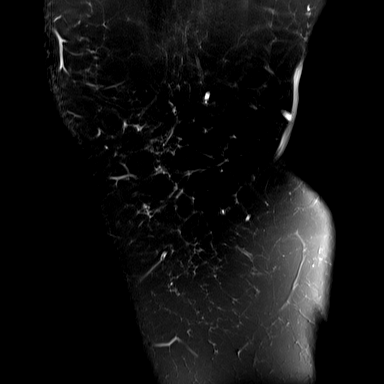

[Series 11: PD · coronal · right · 1.5mm · 0.44mm/px · 5 of 21 slices shown]
[im 1/21]
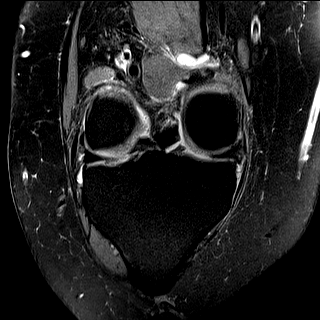
[im 6/21]
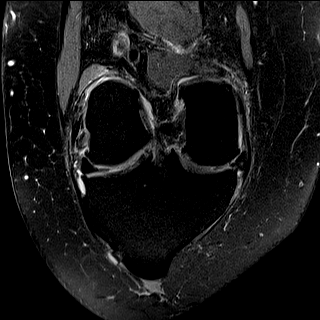
[im 11/21]
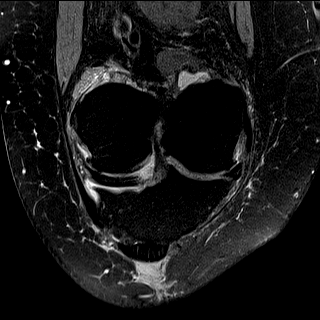
[im 16/21]
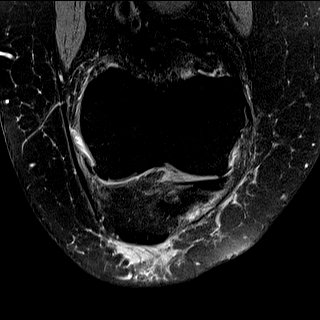
[im 21/21]
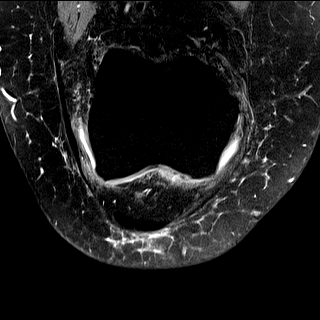

[33 of 40 positions shown; findings below may reference images not displayed]

FINDINGS: MENISCI

Medial meniscus: The patient has a complete radial tear through the
posterior horn just peripheral to the meniscal root. The body is
extruded peripherally with intrasubstance degenerative signal and
fraying along its free edge.

Lateral meniscus: Small longitudinal tear is seen in the anterior
body.

LIGAMENTS

Cruciates:  Intact.

Collaterals:  Intact.

CARTILAGE

Patellofemoral: Extensive cartilage loss is present diffusely about
the patella.

Medial:  Thinned throughout without focal defect.

Lateral:  Mildly degenerated.

Joint:  Small joint effusion.

Popliteal Fossa: Baker's cyst containing debris measures 1.1 cm
transverse x 2.9 cm AP x 4.9 cm craniocaudal. Fluid has dissected
out of the cyst along the medial gastrocnemius.

Extensor Mechanism:  Intact.

Bones: No fracture or worrisome lesion. There is some osteophytosis
about the knee. Intense subchondral edema is seen in the inferior
pole of the patella in the lateral facet.

Other: Subcutaneous pretibial edema consistent bursitis is noted.
IMPRESSION: Complete radial tear just peripheral to the root of the posterior
horn of the medial meniscus.

Small longitudinal tear anterior body of the lateral meniscus.

Osteoarthritis about the knee appearing worst in the patellofemoral
and medial compartments.

Moderate sized Baker's cyst contains debris. Fluid has dissected out
of the cyst along the gastrocnemius.

Pretibial edema consistent with bursitis.

## 2018-01-24 DIAGNOSIS — Z130 Encounter for screening for diseases of the blood and blood-forming organs and certain disorders involving the immune mechanism: Secondary | ICD-10-CM | POA: Diagnosis not present

## 2018-01-24 DIAGNOSIS — Z01419 Encounter for gynecological examination (general) (routine) without abnormal findings: Secondary | ICD-10-CM | POA: Diagnosis not present

## 2018-01-24 DIAGNOSIS — Z124 Encounter for screening for malignant neoplasm of cervix: Secondary | ICD-10-CM | POA: Diagnosis not present

## 2018-01-24 DIAGNOSIS — M8580 Other specified disorders of bone density and structure, unspecified site: Secondary | ICD-10-CM | POA: Diagnosis not present

## 2018-01-24 DIAGNOSIS — D494 Neoplasm of unspecified behavior of bladder: Secondary | ICD-10-CM | POA: Diagnosis not present

## 2018-01-24 DIAGNOSIS — Z1389 Encounter for screening for other disorder: Secondary | ICD-10-CM | POA: Diagnosis not present

## 2018-01-24 DIAGNOSIS — Z1231 Encounter for screening mammogram for malignant neoplasm of breast: Secondary | ICD-10-CM | POA: Diagnosis not present

## 2018-01-25 DIAGNOSIS — I10 Essential (primary) hypertension: Secondary | ICD-10-CM | POA: Diagnosis not present

## 2018-01-25 DIAGNOSIS — R7301 Impaired fasting glucose: Secondary | ICD-10-CM | POA: Diagnosis not present

## 2018-01-25 DIAGNOSIS — E7849 Other hyperlipidemia: Secondary | ICD-10-CM | POA: Diagnosis not present

## 2018-01-25 DIAGNOSIS — E559 Vitamin D deficiency, unspecified: Secondary | ICD-10-CM | POA: Diagnosis not present

## 2018-01-25 DIAGNOSIS — M859 Disorder of bone density and structure, unspecified: Secondary | ICD-10-CM | POA: Diagnosis not present

## 2018-01-25 DIAGNOSIS — R82998 Other abnormal findings in urine: Secondary | ICD-10-CM | POA: Diagnosis not present

## 2018-02-03 DIAGNOSIS — E668 Other obesity: Secondary | ICD-10-CM | POA: Diagnosis not present

## 2018-02-03 DIAGNOSIS — E7849 Other hyperlipidemia: Secondary | ICD-10-CM | POA: Diagnosis not present

## 2018-02-03 DIAGNOSIS — M859 Disorder of bone density and structure, unspecified: Secondary | ICD-10-CM | POA: Diagnosis not present

## 2018-02-03 DIAGNOSIS — E559 Vitamin D deficiency, unspecified: Secondary | ICD-10-CM | POA: Diagnosis not present

## 2018-02-03 DIAGNOSIS — R7301 Impaired fasting glucose: Secondary | ICD-10-CM | POA: Diagnosis not present

## 2018-02-03 DIAGNOSIS — Z1389 Encounter for screening for other disorder: Secondary | ICD-10-CM | POA: Diagnosis not present

## 2018-02-03 DIAGNOSIS — Z8551 Personal history of malignant neoplasm of bladder: Secondary | ICD-10-CM | POA: Diagnosis not present

## 2018-02-03 DIAGNOSIS — Z6833 Body mass index (BMI) 33.0-33.9, adult: Secondary | ICD-10-CM | POA: Diagnosis not present

## 2018-02-03 DIAGNOSIS — Z0000 Encounter for general adult medical examination without abnormal findings: Secondary | ICD-10-CM | POA: Diagnosis not present

## 2018-02-03 DIAGNOSIS — Z1212 Encounter for screening for malignant neoplasm of rectum: Secondary | ICD-10-CM | POA: Diagnosis not present

## 2018-02-03 DIAGNOSIS — J984 Other disorders of lung: Secondary | ICD-10-CM | POA: Diagnosis not present

## 2018-02-03 DIAGNOSIS — I10 Essential (primary) hypertension: Secondary | ICD-10-CM | POA: Diagnosis not present

## 2018-02-03 DIAGNOSIS — M25562 Pain in left knee: Secondary | ICD-10-CM | POA: Diagnosis not present

## 2018-02-28 DIAGNOSIS — R102 Pelvic and perineal pain: Secondary | ICD-10-CM | POA: Diagnosis not present

## 2018-02-28 DIAGNOSIS — N393 Stress incontinence (female) (male): Secondary | ICD-10-CM | POA: Diagnosis not present

## 2018-02-28 DIAGNOSIS — C672 Malignant neoplasm of lateral wall of bladder: Secondary | ICD-10-CM | POA: Diagnosis not present

## 2018-04-12 DIAGNOSIS — E559 Vitamin D deficiency, unspecified: Secondary | ICD-10-CM | POA: Diagnosis not present

## 2018-04-12 DIAGNOSIS — M859 Disorder of bone density and structure, unspecified: Secondary | ICD-10-CM | POA: Diagnosis not present

## 2018-04-12 DIAGNOSIS — Z79899 Other long term (current) drug therapy: Secondary | ICD-10-CM | POA: Diagnosis not present

## 2018-04-26 ENCOUNTER — Encounter (HOSPITAL_COMMUNITY): Payer: Medicare Other

## 2018-04-26 ENCOUNTER — Other Ambulatory Visit (HOSPITAL_COMMUNITY): Payer: Self-pay | Admitting: *Deleted

## 2018-04-27 ENCOUNTER — Ambulatory Visit (HOSPITAL_COMMUNITY)
Admission: RE | Admit: 2018-04-27 | Discharge: 2018-04-27 | Disposition: A | Discharge status: Home / Self Care | Payer: Medicare Other | Source: Ambulatory Visit | Attending: Internal Medicine | Admitting: Internal Medicine

## 2018-04-27 DIAGNOSIS — M810 Age-related osteoporosis without current pathological fracture: Secondary | ICD-10-CM | POA: Diagnosis not present

## 2018-04-27 MED ORDER — ZOLEDRONIC ACID 5 MG/100ML IV SOLN
INTRAVENOUS | Status: AC
  Administered 2018-04-27: 08:00:00 5 mg via INTRAVENOUS
  Filled 2018-04-27: qty 100

## 2018-04-27 MED ORDER — ZOLEDRONIC ACID 5 MG/100ML IV SOLN
5.0000 mg | Freq: Once | INTRAVENOUS | Status: AC
  Administered 2018-04-27: 5 mg via INTRAVENOUS

## 2018-11-06 ENCOUNTER — Ambulatory Visit (INDEPENDENT_AMBULATORY_CARE_PROVIDER_SITE_OTHER): Payer: Medicare Other | Admitting: Physician Assistant

## 2018-11-06 ENCOUNTER — Encounter: Payer: Self-pay | Admitting: Physician Assistant

## 2018-11-06 VITALS — Ht 60.0 in | Wt 170.0 lb

## 2018-11-06 DIAGNOSIS — M654 Radial styloid tenosynovitis [de Quervain]: Secondary | ICD-10-CM

## 2018-11-06 DIAGNOSIS — M1712 Unilateral primary osteoarthritis, left knee: Secondary | ICD-10-CM

## 2018-11-06 MED ORDER — METHYLPREDNISOLONE ACETATE 40 MG/ML IJ SUSP
40.0000 mg | INTRAMUSCULAR | Status: AC | PRN
  Administered 2018-11-06: 40 mg via INTRA_ARTICULAR

## 2018-11-06 MED ORDER — LIDOCAINE HCL 1 % IJ SOLN
3.0000 mL | INTRAMUSCULAR | Status: AC | PRN
  Administered 2018-11-06: 09:00:00 3 mL

## 2018-11-06 NOTE — Progress Notes (Signed)
Office Visit Note   Patient: Lindsay Burch           Date of Birth: 1950-12-11           MRN: [id number] Visit Date: 11/06/2018              Requested by: Marton Redwood, MD [address] [place],  [place] [address] PCP: Marton Redwood, MD   Assessment & Plan: Visit Diagnoses:  1. De Quervain's tenosynovitis, right   2. Primary osteoarthritis of left knee     Plan: Place her in a thumb spica removable splint she will wear this primarily when involved in activities like keying or sewing.  She can take it off for bathing purposes.  She will apply Voltaren gel over the dorsal aspect of the right first extensor compartment.  Regards to her knee will try to obtain a supplemental injection and have her back once this is available.  We will review her symptoms of the right wrist at that time.  Questions encouraged and answered.  Follow-Up Instructions: Return for Supplemental injection.   Orders:  Orders Placed This Encounter  Procedures  . Large Joint Inj   No orders of the defined types were placed in this encounter.     Procedures: Large Joint Inj: L knee on 11/06/2018 9:13 AM Indications: pain Details: 22 G 1.5 in needle, anterolateral approach  Arthrogram: No  Medications: 3 mL lidocaine 1 %; 40 mg methylPREDNISolone acetate 40 MG/ML Outcome: tolerated well, no immediate complications Procedure, treatment alternatives, risks and benefits explained, specific risks discussed. Consent was given by the patient. Immediately prior to procedure a time out was called to verify the correct patient, procedure, equipment, support staff and site/side marked as required. Patient was prepped and draped in the usual sterile fashion.       Clinical Data: No additional findings.   Subjective: Chief Complaint  Patient presents with  . Right Wrist - Pain  . Left Knee - Pain    HPI [name] is well-known.by my service comes in today with 2 complaints one is right wrist pain  worse over the past month no known injury.  Pains at the base of the thumb.  Soreness she has carpal tunnel syndrome.  She has had no swelling.  Denies any numbness tingling the hand.  Advil has given her some relief. Left knee supplemental injection last September did well until recently.  Now having some pain in the knee going up stairs and also feels knee may give way at times.  No new injury.  She does wear compression brace was used for some benefit and relief of pain. Patient is nondiabetic.  States she has had no major medical changes since last seen. Review of Systems Denies any fevers chills shortness of breath chest pain.  Objective: Vital Signs: Ht 5' (1.524 m)   Wt 170 lb (77.1 kg)   BMI 33.20 kg/m   Physical Exam General: Well-developed well-nourished female no acute distress mood and affect appropriate Psych: Alert and oriented x3  Ortho Exam Left knee full extension flexion.  Tenderness along medial lateral joint lines.  No instability with valgus varus stressing.  No abnormal warmth erythema or effusion. Right wrist good range of motion without significant pain.  Tenderness over the first extensor compartment dorsally.  Positive Finkelstein's on the right negative on the left.  No rashes skin lesions ulcerations either hand.  Radial pulses are 2+ bilaterally equal symmetric.  No tenderness over the medial lateral  epicondyle of the right elbow. Specialty Comments:  No specialty comments available.  Imaging: No results found.   PMFS History: Patient Active Problem List   Diagnosis Date Noted  . Primary osteoarthritis of left knee 11/07/2017  . Status post arthroscopy of right knee 02/24/2017  . Acute pain of right knee 12/30/2016   Past Medical History:  Diagnosis Date  . Allergy    seasonal  . Bladder cancer ([place])   . Hyperlipidemia   . Wears glasses     Family History  Problem Relation Age of Onset  . Congestive Heart Failure Mother        died 73 from this    . Heart disease Father 58       died from MI 19   . Colon cancer Neg Hx   . Colon polyps Neg Hx   . Esophageal cancer Neg Hx   . Rectal cancer Neg Hx   . Stomach cancer Neg Hx     Past Surgical History:  Procedure Laterality Date  . CESAREAN SECTION  X3  LAST ONE 1976   LAST ONE W/ TUBAL LIGATION  . COLONOSCOPY  2006    in [place]   . CYSTOSCOPY W/ URETERAL STENT PLACEMENT Bilateral 01/17/2013   Procedure: CYSTOSCOPY WITH BILATERAL RETROGRADE PYELOGRAM/ LEFT URETERAL STENT PLACEMENT;  Surgeon: Alexis Frock, MD;  Location: [facility];  Service: Urology;  Laterality: Bilateral;  . TONSILLECTOMY  AGE 62'S  . TRANSURETHRAL RESECTION OF BLADDER TUMOR N/A 01/17/2013   Procedure: TRANSURETHRAL RESECTION OF BLADDER TUMOR (TURBT);  Surgeon: Alexis Frock, MD;  Location: [facility];  Service: Urology;  Laterality: N/A;  . TUBAL LIGATION     Social History   Occupational History  . Not on file  Tobacco Use  . Smoking status: Former Smoker    Packs/day: 1.00    Years: 16.00    Pack years: 16.00    Types: Cigarettes    Quit date: 01/11/1993    Years since quitting: 25.8  . Smokeless tobacco: Never Used  Substance and Sexual Activity  . Alcohol use: Yes    Alcohol/week: 0.0 standard drinks    Comment: VERY RARE  . Drug use: No  . Sexual activity: Not on file

## 2018-11-24 DIAGNOSIS — Z23 Encounter for immunization: Secondary | ICD-10-CM | POA: Diagnosis not present

## 2018-12-20 ENCOUNTER — Telehealth: Payer: Self-pay

## 2018-12-20 NOTE — Telephone Encounter (Signed)
Submitted VOB for Gel-One, left knee. 

## 2018-12-29 ENCOUNTER — Telehealth: Payer: Self-pay

## 2018-12-29 NOTE — Telephone Encounter (Signed)
Patient is aware that she is approved for gel injection.  Approved for Gel-One, left knee. Buy & Bill Covered at 100% through secondary insurance Scientist, clinical (histocompatibility and immunogenetics)).  No Co-pay No PA required  Appt. 01/10/2019 with Dr. Ninfa Linden

## 2019-01-10 ENCOUNTER — Ambulatory Visit: Payer: Medicare Other | Admitting: Orthopaedic Surgery

## 2019-01-16 ENCOUNTER — Encounter: Payer: Self-pay | Admitting: Orthopaedic Surgery

## 2019-01-16 ENCOUNTER — Other Ambulatory Visit: Payer: Self-pay

## 2019-01-16 ENCOUNTER — Ambulatory Visit (INDEPENDENT_AMBULATORY_CARE_PROVIDER_SITE_OTHER): Payer: Medicare Other | Admitting: Orthopaedic Surgery

## 2019-01-16 DIAGNOSIS — M1712 Unilateral primary osteoarthritis, left knee: Secondary | ICD-10-CM

## 2019-01-16 MED ORDER — HYALURONAN 88 MG/4ML IX SOSY
88.0000 mg | PREFILLED_SYRINGE | INTRA_ARTICULAR | Status: AC | PRN
  Administered 2019-01-16: 88 mg via INTRA_ARTICULAR

## 2019-01-16 NOTE — Progress Notes (Signed)
   Procedure Note  Patient: Lindsay Burch             Date of Birth: 1951-01-27           MRN: [id number]             Visit Date: 01/16/2019  Procedures: Visit Diagnoses: No diagnosis found.  Large Joint Inj: L knee on 01/16/2019 8:39 AM Indications: diagnostic evaluation and pain Details: 22 G 1.5 in needle, superolateral approach  Arthrogram: No  Medications: 88 mg Hyaluronan 88 MG/4ML Outcome: tolerated well, no immediate complications Procedure, treatment alternatives, risks and benefits explained, specific risks discussed. Consent was given by the patient. Immediately prior to procedure a time out was called to verify the correct patient, procedure, equipment, support staff and site/side marked as required. Patient was prepped and draped in the usual sterile fashion.    The patient has known osteoarthritis and degenerative joint disease of the left knee.  She is here today for a hyaluronic acid injection with Monovisc to treat the pain from osteoarthritis.  She has had injections like this in the past that is helped greatly.  She is already failed other forms of conservative treatment including steroid injection.  She has had no other acute change in her medical status.  On examination of her left knee today there is no effusion with good range of motion but joint line tenderness.  She tolerated the hyaluronic acid injection well without problems.  All question concerns were answered and addressed.  Follow-up will be as needed.

## 2019-02-02 DIAGNOSIS — H2513 Age-related nuclear cataract, bilateral: Secondary | ICD-10-CM | POA: Diagnosis not present

## 2019-02-02 DIAGNOSIS — H52223 Regular astigmatism, bilateral: Secondary | ICD-10-CM | POA: Diagnosis not present

## 2019-02-02 DIAGNOSIS — H30023 Focal chorioretinal inflammation of posterior pole, bilateral: Secondary | ICD-10-CM | POA: Diagnosis not present

## 2019-02-02 DIAGNOSIS — H5203 Hypermetropia, bilateral: Secondary | ICD-10-CM | POA: Diagnosis not present

## 2019-02-02 DIAGNOSIS — H524 Presbyopia: Secondary | ICD-10-CM | POA: Diagnosis not present

## 2019-02-06 DIAGNOSIS — R7301 Impaired fasting glucose: Secondary | ICD-10-CM | POA: Diagnosis not present

## 2019-02-06 DIAGNOSIS — M859 Disorder of bone density and structure, unspecified: Secondary | ICD-10-CM | POA: Diagnosis not present

## 2019-02-06 DIAGNOSIS — E7849 Other hyperlipidemia: Secondary | ICD-10-CM | POA: Diagnosis not present

## 2019-02-07 DIAGNOSIS — R82998 Other abnormal findings in urine: Secondary | ICD-10-CM | POA: Diagnosis not present

## 2019-04-13 DIAGNOSIS — Z1212 Encounter for screening for malignant neoplasm of rectum: Secondary | ICD-10-CM | POA: Diagnosis not present

## 2019-05-01 DIAGNOSIS — Z79899 Other long term (current) drug therapy: Secondary | ICD-10-CM | POA: Diagnosis not present

## 2019-05-01 DIAGNOSIS — M859 Disorder of bone density and structure, unspecified: Secondary | ICD-10-CM | POA: Diagnosis not present

## 2019-05-01 DIAGNOSIS — E559 Vitamin D deficiency, unspecified: Secondary | ICD-10-CM | POA: Diagnosis not present

## 2019-05-18 ENCOUNTER — Other Ambulatory Visit (HOSPITAL_COMMUNITY): Payer: Self-pay | Admitting: *Deleted

## 2019-05-20 ENCOUNTER — Ambulatory Visit: Payer: Medicare Other | Attending: Internal Medicine

## 2019-05-20 DIAGNOSIS — Z23 Encounter for immunization: Secondary | ICD-10-CM | POA: Insufficient documentation

## 2019-05-20 NOTE — Progress Notes (Signed)
   Covid-19 Vaccination Clinic  Name:  Lindsay Burch    MRN: [id number] DOB: 10/22/1950  05/20/2019  Lindsay Burch was observed post Covid-19 immunization for 30 minutes based on pre-vaccination screening without incident. She was provided with Vaccine Information Sheet and instruction to access the V-Safe system.   Lindsay Burch was instructed to call 911 with any severe reactions post vaccine: Marland Kitchen Difficulty breathing  . Swelling of face and throat  . A fast heartbeat  . A bad rash all over body  . Dizziness and weakness   Immunizations Administered    Name Date Dose VIS Date Route   Pfizer COVID-19 Vaccine 05/20/2019  3:34 PM 0.3 mL 02/23/2019 Intramuscular   Manufacturer: [place]   Lot: [id number]   [place]: [id number]

## 2019-05-21 ENCOUNTER — Other Ambulatory Visit: Payer: Self-pay

## 2019-05-21 ENCOUNTER — Ambulatory Visit (HOSPITAL_COMMUNITY)
Admission: RE | Admit: 2019-05-21 | Discharge: 2019-05-21 | Disposition: A | Discharge status: Home / Self Care | Payer: Medicare Other | Source: Ambulatory Visit | Attending: Internal Medicine | Admitting: Internal Medicine

## 2019-05-21 DIAGNOSIS — M810 Age-related osteoporosis without current pathological fracture: Secondary | ICD-10-CM | POA: Insufficient documentation

## 2019-05-21 MED ORDER — ZOLEDRONIC ACID 5 MG/100ML IV SOLN: 5.0000 mg | Freq: Once | INTRAVENOUS | Status: AC

## 2019-05-21 MED ORDER — ZOLEDRONIC ACID 5 MG/100ML IV SOLN
INTRAVENOUS | Status: AC
  Administered 2019-05-21: 5 mg via INTRAVENOUS
  Filled 2019-05-21: qty 100

## 2019-06-12 ENCOUNTER — Ambulatory Visit: Payer: Medicare Other | Attending: Internal Medicine

## 2019-06-12 DIAGNOSIS — Z23 Encounter for immunization: Secondary | ICD-10-CM

## 2019-06-12 NOTE — Progress Notes (Signed)
   Covid-19 Vaccination Clinic  Name:  KENDREA PACER    MRN: [id number] DOB: 1951/01/20  06/12/2019  Ms. Chapa was observed post Covid-19 immunization for 15 minutes without incident. She was provided with Vaccine Information Sheet and instruction to access the V-Safe system.   Ms. Honer was instructed to call 911 with any severe reactions post vaccine: Marland Kitchen Difficulty breathing  . Swelling of face and throat  . A fast heartbeat  . A bad rash all over body  . Dizziness and weakness   Immunizations Administered    Name Date Dose VIS Date Route   Pfizer COVID-19 Vaccine 06/12/2019 11:38 AM 0.3 mL 02/23/2019 Intramuscular   Manufacturer: [place]   Lot: [id number]   [place]: [id number]

## 2019-10-01 DIAGNOSIS — H5712 Ocular pain, left eye: Secondary | ICD-10-CM | POA: Diagnosis not present

## 2019-10-01 DIAGNOSIS — H52223 Regular astigmatism, bilateral: Secondary | ICD-10-CM | POA: Diagnosis not present

## 2019-10-01 DIAGNOSIS — H5203 Hypermetropia, bilateral: Secondary | ICD-10-CM | POA: Diagnosis not present

## 2019-10-01 DIAGNOSIS — H524 Presbyopia: Secondary | ICD-10-CM | POA: Diagnosis not present

## 2019-12-15 DIAGNOSIS — Z23 Encounter for immunization: Secondary | ICD-10-CM | POA: Diagnosis not present

## 2020-01-30 DIAGNOSIS — Z01419 Encounter for gynecological examination (general) (routine) without abnormal findings: Secondary | ICD-10-CM | POA: Diagnosis not present

## 2020-01-30 DIAGNOSIS — Z1231 Encounter for screening mammogram for malignant neoplasm of breast: Secondary | ICD-10-CM | POA: Diagnosis not present

## 2020-01-30 DIAGNOSIS — M8580 Other specified disorders of bone density and structure, unspecified site: Secondary | ICD-10-CM | POA: Diagnosis not present

## 2020-01-30 DIAGNOSIS — N951 Menopausal and female climacteric states: Secondary | ICD-10-CM | POA: Diagnosis not present

## 2020-02-11 DIAGNOSIS — E785 Hyperlipidemia, unspecified: Secondary | ICD-10-CM | POA: Diagnosis not present

## 2020-02-11 DIAGNOSIS — R7301 Impaired fasting glucose: Secondary | ICD-10-CM | POA: Diagnosis not present

## 2020-02-11 DIAGNOSIS — M859 Disorder of bone density and structure, unspecified: Secondary | ICD-10-CM | POA: Diagnosis not present

## 2020-02-18 DIAGNOSIS — Z0000 Encounter for general adult medical examination without abnormal findings: Secondary | ICD-10-CM | POA: Diagnosis not present

## 2020-02-18 DIAGNOSIS — K921 Melena: Secondary | ICD-10-CM | POA: Diagnosis not present

## 2020-02-18 DIAGNOSIS — I10 Essential (primary) hypertension: Secondary | ICD-10-CM | POA: Diagnosis not present

## 2020-02-18 DIAGNOSIS — E669 Obesity, unspecified: Secondary | ICD-10-CM | POA: Diagnosis not present

## 2020-02-18 DIAGNOSIS — F17201 Nicotine dependence, unspecified, in remission: Secondary | ICD-10-CM | POA: Diagnosis not present

## 2020-02-18 DIAGNOSIS — Z1331 Encounter for screening for depression: Secondary | ICD-10-CM | POA: Diagnosis not present

## 2020-02-18 DIAGNOSIS — Z8551 Personal history of malignant neoplasm of bladder: Secondary | ICD-10-CM | POA: Diagnosis not present

## 2020-02-18 DIAGNOSIS — Z1339 Encounter for screening examination for other mental health and behavioral disorders: Secondary | ICD-10-CM | POA: Diagnosis not present

## 2020-02-18 DIAGNOSIS — M1380 Other specified arthritis, unspecified site: Secondary | ICD-10-CM | POA: Diagnosis not present

## 2020-02-18 DIAGNOSIS — R82998 Other abnormal findings in urine: Secondary | ICD-10-CM | POA: Diagnosis not present

## 2020-02-18 DIAGNOSIS — L309 Dermatitis, unspecified: Secondary | ICD-10-CM | POA: Diagnosis not present

## 2020-02-18 DIAGNOSIS — M8580 Other specified disorders of bone density and structure, unspecified site: Secondary | ICD-10-CM | POA: Diagnosis not present

## 2020-02-18 DIAGNOSIS — E559 Vitamin D deficiency, unspecified: Secondary | ICD-10-CM | POA: Diagnosis not present

## 2020-02-18 DIAGNOSIS — R7301 Impaired fasting glucose: Secondary | ICD-10-CM | POA: Diagnosis not present

## 2020-02-18 DIAGNOSIS — H15009 Unspecified scleritis, unspecified eye: Secondary | ICD-10-CM | POA: Diagnosis not present

## 2020-02-18 DIAGNOSIS — E785 Hyperlipidemia, unspecified: Secondary | ICD-10-CM | POA: Diagnosis not present

## 2020-02-18 DIAGNOSIS — R0989 Other specified symptoms and signs involving the circulatory and respiratory systems: Secondary | ICD-10-CM | POA: Diagnosis not present

## 2020-02-20 ENCOUNTER — Ambulatory Visit (HOSPITAL_COMMUNITY)
Admission: RE | Admit: 2020-02-20 | Discharge: 2020-02-20 | Disposition: A | Discharge status: Home / Self Care | Payer: Medicare Other | Source: Ambulatory Visit | Attending: Internal Medicine | Admitting: Internal Medicine

## 2020-02-20 ENCOUNTER — Other Ambulatory Visit: Payer: Self-pay

## 2020-02-20 ENCOUNTER — Other Ambulatory Visit (HOSPITAL_COMMUNITY): Payer: Self-pay | Admitting: Internal Medicine

## 2020-02-20 DIAGNOSIS — R0989 Other specified symptoms and signs involving the circulatory and respiratory systems: Secondary | ICD-10-CM | POA: Insufficient documentation

## 2020-03-10 DIAGNOSIS — C672 Malignant neoplasm of lateral wall of bladder: Secondary | ICD-10-CM | POA: Diagnosis not present

## 2020-04-02 DIAGNOSIS — Z23 Encounter for immunization: Secondary | ICD-10-CM | POA: Diagnosis not present

## 2020-04-07 DIAGNOSIS — M8589 Other specified disorders of bone density and structure, multiple sites: Secondary | ICD-10-CM | POA: Diagnosis not present

## 2020-09-08 DIAGNOSIS — Z8551 Personal history of malignant neoplasm of bladder: Secondary | ICD-10-CM | POA: Diagnosis not present

## 2020-09-08 DIAGNOSIS — I771 Stricture of artery: Secondary | ICD-10-CM | POA: Diagnosis not present

## 2020-09-08 DIAGNOSIS — R109 Unspecified abdominal pain: Secondary | ICD-10-CM | POA: Diagnosis not present

## 2020-09-08 DIAGNOSIS — H15009 Unspecified scleritis, unspecified eye: Secondary | ICD-10-CM | POA: Diagnosis not present

## 2020-09-08 DIAGNOSIS — M8580 Other specified disorders of bone density and structure, unspecified site: Secondary | ICD-10-CM | POA: Diagnosis not present

## 2020-09-08 DIAGNOSIS — E559 Vitamin D deficiency, unspecified: Secondary | ICD-10-CM | POA: Diagnosis not present

## 2020-09-08 DIAGNOSIS — E785 Hyperlipidemia, unspecified: Secondary | ICD-10-CM | POA: Diagnosis not present

## 2020-09-08 DIAGNOSIS — E669 Obesity, unspecified: Secondary | ICD-10-CM | POA: Diagnosis not present

## 2020-09-08 DIAGNOSIS — R0989 Other specified symptoms and signs involving the circulatory and respiratory systems: Secondary | ICD-10-CM | POA: Diagnosis not present

## 2020-09-08 DIAGNOSIS — R7301 Impaired fasting glucose: Secondary | ICD-10-CM | POA: Diagnosis not present

## 2020-09-08 DIAGNOSIS — I10 Essential (primary) hypertension: Secondary | ICD-10-CM | POA: Diagnosis not present

## 2020-09-08 DIAGNOSIS — R400 Somnolence: Secondary | ICD-10-CM | POA: Diagnosis not present

## 2020-09-18 DIAGNOSIS — H30003 Unspecified focal chorioretinal inflammation, bilateral: Secondary | ICD-10-CM | POA: Diagnosis not present

## 2020-09-18 DIAGNOSIS — H3589 Other specified retinal disorders: Secondary | ICD-10-CM | POA: Diagnosis not present

## 2020-09-18 DIAGNOSIS — H5203 Hypermetropia, bilateral: Secondary | ICD-10-CM | POA: Diagnosis not present

## 2020-09-18 DIAGNOSIS — H52223 Regular astigmatism, bilateral: Secondary | ICD-10-CM | POA: Diagnosis not present

## 2020-09-18 DIAGNOSIS — H524 Presbyopia: Secondary | ICD-10-CM | POA: Diagnosis not present

## 2020-09-18 DIAGNOSIS — H2513 Age-related nuclear cataract, bilateral: Secondary | ICD-10-CM | POA: Diagnosis not present

## 2020-10-13 DIAGNOSIS — S40862A Insect bite (nonvenomous) of left upper arm, initial encounter: Secondary | ICD-10-CM | POA: Diagnosis not present

## 2020-10-13 DIAGNOSIS — W57XXXA Bitten or stung by nonvenomous insect and other nonvenomous arthropods, initial encounter: Secondary | ICD-10-CM | POA: Diagnosis not present

## 2020-10-13 DIAGNOSIS — L03114 Cellulitis of left upper limb: Secondary | ICD-10-CM | POA: Diagnosis not present

## 2021-01-10 DIAGNOSIS — Z23 Encounter for immunization: Secondary | ICD-10-CM | POA: Diagnosis not present

## 2021-03-02 DIAGNOSIS — M79642 Pain in left hand: Secondary | ICD-10-CM | POA: Diagnosis not present

## 2021-03-02 DIAGNOSIS — M79641 Pain in right hand: Secondary | ICD-10-CM | POA: Diagnosis not present

## 2021-03-10 DIAGNOSIS — C672 Malignant neoplasm of lateral wall of bladder: Secondary | ICD-10-CM | POA: Diagnosis not present

## 2021-03-10 DIAGNOSIS — N393 Stress incontinence (female) (male): Secondary | ICD-10-CM | POA: Diagnosis not present

## 2021-03-12 DIAGNOSIS — M13841 Other specified arthritis, right hand: Secondary | ICD-10-CM | POA: Diagnosis not present

## 2021-03-12 DIAGNOSIS — S62623D Displaced fracture of medial phalanx of left middle finger, subsequent encounter for fracture with routine healing: Secondary | ICD-10-CM | POA: Diagnosis not present

## 2021-03-17 DIAGNOSIS — E785 Hyperlipidemia, unspecified: Secondary | ICD-10-CM | POA: Diagnosis not present

## 2021-03-17 DIAGNOSIS — R7301 Impaired fasting glucose: Secondary | ICD-10-CM | POA: Diagnosis not present

## 2021-03-17 DIAGNOSIS — I10 Essential (primary) hypertension: Secondary | ICD-10-CM | POA: Diagnosis not present

## 2021-03-17 DIAGNOSIS — E559 Vitamin D deficiency, unspecified: Secondary | ICD-10-CM | POA: Diagnosis not present

## 2021-03-24 DIAGNOSIS — H15009 Unspecified scleritis, unspecified eye: Secondary | ICD-10-CM | POA: Diagnosis not present

## 2021-03-24 DIAGNOSIS — F17201 Nicotine dependence, unspecified, in remission: Secondary | ICD-10-CM | POA: Diagnosis not present

## 2021-03-24 DIAGNOSIS — R7301 Impaired fasting glucose: Secondary | ICD-10-CM | POA: Diagnosis not present

## 2021-03-24 DIAGNOSIS — Z0000 Encounter for general adult medical examination without abnormal findings: Secondary | ICD-10-CM | POA: Diagnosis not present

## 2021-03-24 DIAGNOSIS — Z8551 Personal history of malignant neoplasm of bladder: Secondary | ICD-10-CM | POA: Diagnosis not present

## 2021-03-24 DIAGNOSIS — E669 Obesity, unspecified: Secondary | ICD-10-CM | POA: Diagnosis not present

## 2021-03-24 DIAGNOSIS — R0989 Other specified symptoms and signs involving the circulatory and respiratory systems: Secondary | ICD-10-CM | POA: Diagnosis not present

## 2021-03-24 DIAGNOSIS — I10 Essential (primary) hypertension: Secondary | ICD-10-CM | POA: Diagnosis not present

## 2021-03-24 DIAGNOSIS — Z1339 Encounter for screening examination for other mental health and behavioral disorders: Secondary | ICD-10-CM | POA: Diagnosis not present

## 2021-03-24 DIAGNOSIS — M8580 Other specified disorders of bone density and structure, unspecified site: Secondary | ICD-10-CM | POA: Diagnosis not present

## 2021-03-24 DIAGNOSIS — R82998 Other abnormal findings in urine: Secondary | ICD-10-CM | POA: Diagnosis not present

## 2021-03-24 DIAGNOSIS — Z1331 Encounter for screening for depression: Secondary | ICD-10-CM | POA: Diagnosis not present

## 2021-03-24 DIAGNOSIS — E559 Vitamin D deficiency, unspecified: Secondary | ICD-10-CM | POA: Diagnosis not present

## 2021-03-24 DIAGNOSIS — E785 Hyperlipidemia, unspecified: Secondary | ICD-10-CM | POA: Diagnosis not present

## 2021-03-24 DIAGNOSIS — I771 Stricture of artery: Secondary | ICD-10-CM | POA: Diagnosis not present

## 2021-04-21 DIAGNOSIS — H524 Presbyopia: Secondary | ICD-10-CM | POA: Diagnosis not present

## 2021-04-21 DIAGNOSIS — H2513 Age-related nuclear cataract, bilateral: Secondary | ICD-10-CM | POA: Diagnosis not present

## 2021-04-21 DIAGNOSIS — H52223 Regular astigmatism, bilateral: Secondary | ICD-10-CM | POA: Diagnosis not present

## 2021-04-21 DIAGNOSIS — H5203 Hypermetropia, bilateral: Secondary | ICD-10-CM | POA: Diagnosis not present

## 2021-09-21 DIAGNOSIS — H2513 Age-related nuclear cataract, bilateral: Secondary | ICD-10-CM | POA: Diagnosis not present

## 2021-09-21 DIAGNOSIS — H31003 Unspecified chorioretinal scars, bilateral: Secondary | ICD-10-CM | POA: Diagnosis not present

## 2021-09-21 DIAGNOSIS — H35033 Hypertensive retinopathy, bilateral: Secondary | ICD-10-CM | POA: Diagnosis not present

## 2021-09-21 DIAGNOSIS — H3589 Other specified retinal disorders: Secondary | ICD-10-CM | POA: Diagnosis not present

## 2021-09-21 DIAGNOSIS — I10 Essential (primary) hypertension: Secondary | ICD-10-CM | POA: Diagnosis not present

## 2021-11-14 DIAGNOSIS — M25571 Pain in right ankle and joints of right foot: Secondary | ICD-10-CM | POA: Diagnosis not present

## 2021-11-27 DIAGNOSIS — Z23 Encounter for immunization: Secondary | ICD-10-CM | POA: Diagnosis not present

## 2021-12-01 DIAGNOSIS — S8264XA Nondisplaced fracture of lateral malleolus of right fibula, initial encounter for closed fracture: Secondary | ICD-10-CM | POA: Diagnosis not present

## 2021-12-01 DIAGNOSIS — M25571 Pain in right ankle and joints of right foot: Secondary | ICD-10-CM | POA: Diagnosis not present

## 2021-12-24 DIAGNOSIS — Z1231 Encounter for screening mammogram for malignant neoplasm of breast: Secondary | ICD-10-CM | POA: Diagnosis not present

## 2021-12-31 DIAGNOSIS — S8264XA Nondisplaced fracture of lateral malleolus of right fibula, initial encounter for closed fracture: Secondary | ICD-10-CM | POA: Diagnosis not present

## 2022-02-01 DIAGNOSIS — M8580 Other specified disorders of bone density and structure, unspecified site: Secondary | ICD-10-CM | POA: Diagnosis not present

## 2022-02-01 DIAGNOSIS — Z01419 Encounter for gynecological examination (general) (routine) without abnormal findings: Secondary | ICD-10-CM | POA: Diagnosis not present

## 2022-02-01 DIAGNOSIS — L900 Lichen sclerosus et atrophicus: Secondary | ICD-10-CM | POA: Diagnosis not present

## 2022-02-01 DIAGNOSIS — N952 Postmenopausal atrophic vaginitis: Secondary | ICD-10-CM | POA: Diagnosis not present

## 2022-02-01 DIAGNOSIS — N951 Menopausal and female climacteric states: Secondary | ICD-10-CM | POA: Diagnosis not present

## 2022-03-02 DIAGNOSIS — C672 Malignant neoplasm of lateral wall of bladder: Secondary | ICD-10-CM | POA: Diagnosis not present

## 2022-03-02 DIAGNOSIS — N393 Stress incontinence (female) (male): Secondary | ICD-10-CM | POA: Diagnosis not present

## 2022-04-12 DIAGNOSIS — M8589 Other specified disorders of bone density and structure, multiple sites: Secondary | ICD-10-CM | POA: Diagnosis not present

## 2022-04-12 DIAGNOSIS — R7989 Other specified abnormal findings of blood chemistry: Secondary | ICD-10-CM | POA: Diagnosis not present

## 2022-04-12 DIAGNOSIS — K219 Gastro-esophageal reflux disease without esophagitis: Secondary | ICD-10-CM | POA: Diagnosis not present

## 2022-04-12 DIAGNOSIS — I10 Essential (primary) hypertension: Secondary | ICD-10-CM | POA: Diagnosis not present

## 2022-04-12 DIAGNOSIS — R7301 Impaired fasting glucose: Secondary | ICD-10-CM | POA: Diagnosis not present

## 2022-04-12 DIAGNOSIS — E559 Vitamin D deficiency, unspecified: Secondary | ICD-10-CM | POA: Diagnosis not present

## 2022-04-12 DIAGNOSIS — E785 Hyperlipidemia, unspecified: Secondary | ICD-10-CM | POA: Diagnosis not present

## 2022-04-19 DIAGNOSIS — Z1331 Encounter for screening for depression: Secondary | ICD-10-CM | POA: Diagnosis not present

## 2022-04-19 DIAGNOSIS — Z0000 Encounter for general adult medical examination without abnormal findings: Secondary | ICD-10-CM | POA: Diagnosis not present

## 2022-04-19 DIAGNOSIS — M8580 Other specified disorders of bone density and structure, unspecified site: Secondary | ICD-10-CM | POA: Diagnosis not present

## 2022-04-19 DIAGNOSIS — Z8551 Personal history of malignant neoplasm of bladder: Secondary | ICD-10-CM | POA: Diagnosis not present

## 2022-04-19 DIAGNOSIS — R7301 Impaired fasting glucose: Secondary | ICD-10-CM | POA: Diagnosis not present

## 2022-04-19 DIAGNOSIS — E785 Hyperlipidemia, unspecified: Secondary | ICD-10-CM | POA: Diagnosis not present

## 2022-04-19 DIAGNOSIS — I10 Essential (primary) hypertension: Secondary | ICD-10-CM | POA: Diagnosis not present

## 2022-04-19 DIAGNOSIS — R82998 Other abnormal findings in urine: Secondary | ICD-10-CM | POA: Diagnosis not present

## 2022-04-19 DIAGNOSIS — E669 Obesity, unspecified: Secondary | ICD-10-CM | POA: Diagnosis not present

## 2022-04-19 DIAGNOSIS — Z1339 Encounter for screening examination for other mental health and behavioral disorders: Secondary | ICD-10-CM | POA: Diagnosis not present

## 2022-04-19 DIAGNOSIS — K1120 Sialoadenitis, unspecified: Secondary | ICD-10-CM | POA: Diagnosis not present

## 2022-04-19 DIAGNOSIS — I771 Stricture of artery: Secondary | ICD-10-CM | POA: Diagnosis not present

## 2022-04-19 DIAGNOSIS — R0989 Other specified symptoms and signs involving the circulatory and respiratory systems: Secondary | ICD-10-CM | POA: Diagnosis not present

## 2022-04-27 ENCOUNTER — Other Ambulatory Visit (HOSPITAL_COMMUNITY): Payer: Self-pay | Admitting: *Deleted

## 2022-04-29 ENCOUNTER — Ambulatory Visit (HOSPITAL_COMMUNITY)
Admission: RE | Admit: 2022-04-29 | Discharge: 2022-04-29 | Disposition: A | Discharge status: Home / Self Care | Payer: Medicare Other | Source: Ambulatory Visit | Attending: Internal Medicine | Admitting: Internal Medicine

## 2022-04-29 DIAGNOSIS — M810 Age-related osteoporosis without current pathological fracture: Secondary | ICD-10-CM | POA: Diagnosis not present

## 2022-04-29 MED ORDER — ZOLEDRONIC ACID 5 MG/100ML IV SOLN: 5.0000 mg | Freq: Once | INTRAVENOUS | Status: AC

## 2022-04-29 MED ORDER — ZOLEDRONIC ACID 5 MG/100ML IV SOLN
INTRAVENOUS | Status: AC
  Administered 2022-04-29: 5 mg via INTRAVENOUS
  Filled 2022-04-29: qty 100

## 2022-06-04 DIAGNOSIS — N952 Postmenopausal atrophic vaginitis: Secondary | ICD-10-CM | POA: Diagnosis not present

## 2022-06-04 DIAGNOSIS — L900 Lichen sclerosus et atrophicus: Secondary | ICD-10-CM | POA: Diagnosis not present

## 2022-06-07 DIAGNOSIS — M67962 Unspecified disorder of synovium and tendon, left lower leg: Secondary | ICD-10-CM | POA: Diagnosis not present

## 2022-06-23 DIAGNOSIS — M7662 Achilles tendinitis, left leg: Secondary | ICD-10-CM | POA: Diagnosis not present

## 2022-07-22 ENCOUNTER — Encounter: Payer: Self-pay | Admitting: Physician Assistant

## 2022-07-22 ENCOUNTER — Other Ambulatory Visit (INDEPENDENT_AMBULATORY_CARE_PROVIDER_SITE_OTHER): Payer: Medicare Other

## 2022-07-22 ENCOUNTER — Ambulatory Visit: Payer: Medicare Other | Admitting: Physician Assistant

## 2022-07-22 DIAGNOSIS — M1711 Unilateral primary osteoarthritis, right knee: Secondary | ICD-10-CM

## 2022-07-22 MED ORDER — LIDOCAINE HCL 1 % IJ SOLN
3.0000 mL | INTRAMUSCULAR | Status: AC | PRN
  Administered 2022-07-22: 3 mL

## 2022-07-22 MED ORDER — METHYLPREDNISOLONE ACETATE 40 MG/ML IJ SUSP
40.0000 mg | INTRAMUSCULAR | Status: AC | PRN
  Administered 2022-07-22: 40 mg via INTRA_ARTICULAR

## 2022-07-22 NOTE — Progress Notes (Addendum)
   Procedure Note  Patient: Lindsay Burch             Date of Birth: 1950/10/25           MRN: [id number]             Visit Date: 07/22/2022 HPI: Ms. Rockenbach comes in today with right knee pain.  No known injury.  She has had pain in the knee for the past month.  Last visit with our office was 01/16/2019 at that time she was given a viscosupplementation injection.  Again she has done well until just recently.  She has been taking some Advil.  She notes no mechanical symptoms in the knee.  But feels that the knee could give way at times.  She has tried a brace with an is tried some exercises on her own.    Review of systems: See HPI otherwise negative  Physical exam: General well-developed well-nourished female no acute distress.  Ambulates without any assistive device. Bilateral knees: Good range of motion both knees.  No significant patellofemoral crepitus either knee.  No abnormal warmth erythema or effusion of either knee.  Tenderness along medial joint line of both knees.  No gross instability valgus varus stressing of either knee.  Radiographs: Right knee 2 views: No acute fracture.  Knee is well located.  Moderate narrowing medial joint line.  Mild periarticular spurring throughout the lateral joint line.  Superior periarticular spur the patella consistent with moderate arthritic changes.  Procedures: Visit Diagnoses:  1. Primary osteoarthritis of right knee     Large Joint Inj: R knee on 07/22/2022 2:37 PM Indications: pain Details: 22 G 1.5 in needle, anterolateral approach  Arthrogram: No  Medications: 3 mL lidocaine 1 %; 40 mg methylPREDNISolone acetate 40 MG/ML Outcome: tolerated well, no immediate complications Procedure, treatment alternatives, risks and benefits explained, specific risks discussed. Consent was given by the patient. Immediately prior to procedure a time out was called to verify the correct patient, procedure, equipment, support staff and site/side marked as  required. Patient was prepped and draped in the usual sterile fashion.    Plan: Given patient's significant relief for an extended duration of time with previous viscosupplementation injections recommend repeat viscosupplementation injection of the right knee.  Will have her back once these are available.  Questions are encouraged and answered at length.

## 2022-07-26 ENCOUNTER — Telehealth: Payer: Self-pay

## 2022-07-26 NOTE — Telephone Encounter (Signed)
Please precert for right knee visco. Gil's patient. Thanks!

## 2022-07-27 DIAGNOSIS — R5383 Other fatigue: Secondary | ICD-10-CM | POA: Diagnosis not present

## 2022-07-27 DIAGNOSIS — R0981 Nasal congestion: Secondary | ICD-10-CM | POA: Diagnosis not present

## 2022-07-27 DIAGNOSIS — R638 Other symptoms and signs concerning food and fluid intake: Secondary | ICD-10-CM | POA: Diagnosis not present

## 2022-07-27 DIAGNOSIS — M1380 Other specified arthritis, unspecified site: Secondary | ICD-10-CM | POA: Diagnosis not present

## 2022-07-27 DIAGNOSIS — R051 Acute cough: Secondary | ICD-10-CM | POA: Diagnosis not present

## 2022-07-27 DIAGNOSIS — I10 Essential (primary) hypertension: Secondary | ICD-10-CM | POA: Diagnosis not present

## 2022-07-27 DIAGNOSIS — Z1152 Encounter for screening for COVID-19: Secondary | ICD-10-CM | POA: Diagnosis not present

## 2022-07-27 DIAGNOSIS — J029 Acute pharyngitis, unspecified: Secondary | ICD-10-CM | POA: Diagnosis not present

## 2022-07-27 DIAGNOSIS — J069 Acute upper respiratory infection, unspecified: Secondary | ICD-10-CM | POA: Diagnosis not present

## 2022-07-28 NOTE — Telephone Encounter (Signed)
VOB submitted for Monovisc, right knee  

## 2022-07-29 DIAGNOSIS — L900 Lichen sclerosus et atrophicus: Secondary | ICD-10-CM | POA: Diagnosis not present

## 2022-08-02 ENCOUNTER — Other Ambulatory Visit: Payer: Self-pay

## 2022-08-02 ENCOUNTER — Telehealth: Payer: Self-pay | Admitting: Orthopedic Surgery

## 2022-08-02 DIAGNOSIS — M1711 Unilateral primary osteoarthritis, right knee: Secondary | ICD-10-CM

## 2022-08-02 NOTE — Telephone Encounter (Signed)
Ms. Remund is calling to check on the status of the approval for gel injections in her knees.  Please call [phone]. She would like to know when she will be getting these done.

## 2022-08-02 NOTE — Telephone Encounter (Signed)
Talked with patient and appointment has been scheduled for gel injection.  

## 2022-08-18 ENCOUNTER — Encounter: Payer: Self-pay | Admitting: Orthopaedic Surgery

## 2022-08-18 ENCOUNTER — Ambulatory Visit (INDEPENDENT_AMBULATORY_CARE_PROVIDER_SITE_OTHER): Payer: Medicare Other | Admitting: Orthopaedic Surgery

## 2022-08-18 DIAGNOSIS — M1711 Unilateral primary osteoarthritis, right knee: Secondary | ICD-10-CM

## 2022-08-18 MED ORDER — HYALURONAN 88 MG/4ML IX SOSY
88.0000 mg | PREFILLED_SYRINGE | INTRA_ARTICULAR | Status: AC | PRN
  Administered 2022-08-18: 88 mg via INTRA_ARTICULAR

## 2022-08-18 NOTE — Progress Notes (Signed)
   Procedure Note  Patient: Lindsay Burch             Date of Birth: 1950-09-26           MRN: [id number]             Visit Date: 08/18/2022  Procedures: Visit Diagnoses:  1. Unilateral primary osteoarthritis, right knee     Large Joint Inj: R knee on 08/18/2022 10:22 AM Indications: diagnostic evaluation and pain Details: 22 G 1.5 in needle, superolateral approach  Arthrogram: No  Medications: 88 mg Hyaluronan 88 MG/4ML Outcome: tolerated well, no immediate complications Procedure, treatment alternatives, risks and benefits explained, specific risks discussed. Consent was given by the patient. Immediately prior to procedure a time out was called to verify the correct patient, procedure, equipment, support staff and site/side marked as required. Patient was prepped and draped in the usual sterile fashion.     The patient comes in today for scheduled hyaluronic acid injection in her right knee to treat the pain from osteoarthritis.  She has been dealing with Achilles tendinitis on the left side and is wearing appropriate shoes.  She has been to physical therapy for that and has tried Voltaren gel and a Thera-Band.  She has been to physical therapy as well.  She does know to wear appropriate shoes in terms of nothing up and back.  Examination of her right knee shows no effusion.  I did place Monovisc in that right knee without difficulty.  If her right knee starts to bother her again or her left Achilles is not improved, she knows to come back and see [place].  Lot #[id number] Expiration date 01/04/2025

## 2022-08-19 ENCOUNTER — Ambulatory Visit: Payer: PRIVATE HEALTH INSURANCE | Admitting: Physician Assistant

## 2022-09-20 DIAGNOSIS — I10 Essential (primary) hypertension: Secondary | ICD-10-CM | POA: Diagnosis not present

## 2022-09-20 DIAGNOSIS — H3589 Other specified retinal disorders: Secondary | ICD-10-CM | POA: Diagnosis not present

## 2022-09-20 DIAGNOSIS — H53143 Visual discomfort, bilateral: Secondary | ICD-10-CM | POA: Diagnosis not present

## 2022-09-20 DIAGNOSIS — H35033 Hypertensive retinopathy, bilateral: Secondary | ICD-10-CM | POA: Diagnosis not present

## 2022-09-20 DIAGNOSIS — H31003 Unspecified chorioretinal scars, bilateral: Secondary | ICD-10-CM | POA: Diagnosis not present

## 2022-09-20 DIAGNOSIS — H5203 Hypermetropia, bilateral: Secondary | ICD-10-CM | POA: Diagnosis not present

## 2022-09-20 DIAGNOSIS — H524 Presbyopia: Secondary | ICD-10-CM | POA: Diagnosis not present

## 2022-09-20 DIAGNOSIS — H2513 Age-related nuclear cataract, bilateral: Secondary | ICD-10-CM | POA: Diagnosis not present

## 2022-09-20 DIAGNOSIS — H52221 Regular astigmatism, right eye: Secondary | ICD-10-CM | POA: Diagnosis not present

## 2022-09-22 ENCOUNTER — Ambulatory Visit: Payer: PRIVATE HEALTH INSURANCE | Admitting: Orthopaedic Surgery

## 2022-11-12 DIAGNOSIS — M19072 Primary osteoarthritis, left ankle and foot: Secondary | ICD-10-CM | POA: Diagnosis not present

## 2022-11-12 DIAGNOSIS — M19071 Primary osteoarthritis, right ankle and foot: Secondary | ICD-10-CM | POA: Diagnosis not present

## 2022-11-16 DIAGNOSIS — Z23 Encounter for immunization: Secondary | ICD-10-CM | POA: Diagnosis not present

## 2023-01-27 ENCOUNTER — Ambulatory Visit (INDEPENDENT_AMBULATORY_CARE_PROVIDER_SITE_OTHER): Payer: Medicare Other | Admitting: Physician Assistant

## 2023-01-27 ENCOUNTER — Encounter: Payer: Self-pay | Admitting: Physician Assistant

## 2023-01-27 ENCOUNTER — Other Ambulatory Visit (INDEPENDENT_AMBULATORY_CARE_PROVIDER_SITE_OTHER): Payer: Medicare Other

## 2023-01-27 DIAGNOSIS — M1711 Unilateral primary osteoarthritis, right knee: Secondary | ICD-10-CM | POA: Diagnosis not present

## 2023-01-27 DIAGNOSIS — M1811 Unilateral primary osteoarthritis of first carpometacarpal joint, right hand: Secondary | ICD-10-CM

## 2023-01-27 MED ORDER — LIDOCAINE HCL 1 % IJ SOLN
3.0000 mL | INTRAMUSCULAR | Status: AC | PRN
  Administered 2023-01-27: 3 mL

## 2023-01-27 MED ORDER — DICLOFENAC SODIUM 1 % EX GEL: 2.0000 g | Freq: Four times a day (QID) | CUTANEOUS | 1 refills | Status: DC

## 2023-01-27 MED ORDER — METHYLPREDNISOLONE ACETATE 40 MG/ML IJ SUSP
40.0000 mg | INTRAMUSCULAR | Status: AC | PRN
  Administered 2023-01-27: 40 mg via INTRA_ARTICULAR

## 2023-01-27 NOTE — Progress Notes (Signed)
Office Visit Note   Patient: Lindsay Burch           Date of Birth: 1950/08/22           MRN: [id number] Visit Date: 01/27/2023              Requested by: Cleatis Polka., MD [address] [place],  [place] [address] PCP: Cleatis Polka., MD   Assessment & Plan: Visit Diagnoses:  1. Arthritis of carpometacarpal (CMC) joint of right thumb   2. Unilateral primary osteoarthritis, right knee     Plan: Will have her use Voltaren gel 2 g 4 times daily to the [facility] joint of the right hand.  Offered CMC joint injection versus referral to hand surgery.  She is like to stay as conservative as possible at this point in time.  Also did talk to her about making up a thumb spica splint for the right thumb pain.  Regards to her knees she knows to wait at least 3 months between cortisone injections.   Follow-Up Instructions: Return if symptoms worsen or fail to improve.   Orders:  Orders Placed This Encounter  Procedures   Large Joint Inj   XR Wrist Complete Right   Meds ordered this encounter  Medications   diclofenac Sodium (VOLTAREN) 1 % GEL    Sig: Apply 2 g topically 4 (four) times daily.    Dispense:  350 g    Refill:  1      Procedures: Large Joint Inj on 01/27/2023 4:39 PM Indications: pain Details: 22 G 1.5 in needle, anterolateral approach  Arthrogram: No  Medications: 3 mL lidocaine 1 %; 40 mg methylPREDNISolone acetate 40 MG/ML Outcome: tolerated well, no immediate complications Procedure, treatment alternatives, risks and benefits explained, specific risks discussed. Consent was given by the patient. Immediately prior to procedure a time out was called to verify the correct patient, procedure, equipment, support staff and site/side marked as required. Patient was prepped and draped in the usual sterile fashion.       Clinical Data: No additional findings.   Subjective: Chief Complaint  Patient presents with   Right Knee - Pain   Right Wrist - Pain     HPI [name] comes in today due to right knee pain.  She was last seen 08/18/2022 due to to right knee osteoarthritis given a gel injection.  She states the injection helped for a brief period of time but did not last long.  She is having sharp stabbing pain posterior aspect of the knee.  Wants to know if she could get a cortisone injection today.  She is also having right wrist pain been ongoing for the last month.  She has tried some off-the-shelf Voltaren gel heat.  She has had no known injury.  She does do a fair amount of quilting.  Review of Systems  Constitutional:  Negative for chills and fever.     Objective: Vital Signs: There were no vitals taken for this visit.  Physical Exam Constitutional:      Appearance: She is not ill-appearing or diaphoretic.  Pulmonary:     Effort: Pulmonary effort is normal.  Neurological:     Mental Status: She is alert.     Ortho Exam Right knee : Full extension, full flexion, no instability valgus varus stressing.  No abnormal warmth erythema or effusion.  Tenderness along the posterior medial joint line. Right hand no active triggering of the thumb.  Negative Finkelstein's  test.  Nontender along the first extensor compartment.  Tenderness at the [facility] joint.  Positive grind test. Specialty Comments:  No specialty comments available.  Imaging: XR Wrist Complete Right  Result Date: 01/27/2023 Right wrist: No acute fracture. CMC joint arthritis moderate. No acute findings.     PMFS History: Patient Active Problem List   Diagnosis Date Noted   Primary osteoarthritis of left knee 11/07/2017   Status post arthroscopy of right knee 02/24/2017   Acute pain of right knee 12/30/2016   Past Medical History:  Diagnosis Date   Allergy    seasonal   Bladder cancer ([facility])    Hyperlipidemia    Wears glasses     Family History  Problem Relation Age of Onset   Congestive Heart Failure Mother        died 44 from this    Heart disease  Father 73       died from MI 2    Colon cancer Neg Hx    Colon polyps Neg Hx    Esophageal cancer Neg Hx    Rectal cancer Neg Hx    Stomach cancer Neg Hx     Past Surgical History:  Procedure Laterality Date   CESAREAN SECTION  X3  LAST ONE 1976   LAST ONE W/ TUBAL LIGATION   COLONOSCOPY  2006    in [place]    CYSTOSCOPY W/ URETERAL STENT PLACEMENT Bilateral 01/17/2013   Procedure: CYSTOSCOPY WITH BILATERAL RETROGRADE PYELOGRAM/ LEFT URETERAL STENT PLACEMENT;  Surgeon: Sebastian Ache, MD;  Location: [facility];  Service: Urology;  Laterality: Bilateral;   TONSILLECTOMY  AGE 66'S   TRANSURETHRAL RESECTION OF BLADDER TUMOR N/A 01/17/2013   Procedure: TRANSURETHRAL RESECTION OF BLADDER TUMOR (TURBT);  Surgeon: Sebastian Ache, MD;  Location: [facility];  Service: Urology;  Laterality: N/A;   TUBAL LIGATION     Social History   Occupational History   Not on file  Tobacco Use   Smoking status: Former    Current packs/day: 0.00    Average packs/day: 1 pack/day for 16.0 years (16.0 ttl pk-yrs)    Types: Cigarettes    Start date: 01/11/1977    Quit date: 01/11/1993    Years since quitting: 30.0   Smokeless tobacco: Never  Substance and Sexual Activity   Alcohol use: Yes    Alcohol/week: 0.0 standard drinks of alcohol    Comment: VERY RARE   Drug use: No   Sexual activity: Not on file

## 2023-02-03 DIAGNOSIS — N952 Postmenopausal atrophic vaginitis: Secondary | ICD-10-CM | POA: Diagnosis not present

## 2023-02-03 DIAGNOSIS — L900 Lichen sclerosus et atrophicus: Secondary | ICD-10-CM | POA: Diagnosis not present

## 2023-02-03 DIAGNOSIS — Z1231 Encounter for screening mammogram for malignant neoplasm of breast: Secondary | ICD-10-CM | POA: Diagnosis not present

## 2023-02-11 ENCOUNTER — Other Ambulatory Visit: Payer: Self-pay | Admitting: Physician Assistant

## 2023-02-16 ENCOUNTER — Other Ambulatory Visit: Payer: Self-pay | Admitting: Radiology

## 2023-02-16 MED ORDER — DICLOFENAC SODIUM 1 % EX GEL: 2.0000 g | Freq: Four times a day (QID) | CUTANEOUS | 1 refills | Status: DC

## 2023-02-16 NOTE — Telephone Encounter (Signed)
Sent to [facility]  

## 2023-02-23 ENCOUNTER — Other Ambulatory Visit: Payer: Self-pay | Admitting: Radiology

## 2023-02-23 ENCOUNTER — Telehealth: Payer: Self-pay | Admitting: Physician Assistant

## 2023-02-23 MED ORDER — DICLOFENAC SODIUM 1 % EX GEL: 2.0000 g | Freq: Four times a day (QID) | CUTANEOUS | 1 refills | Status: DC

## 2023-02-23 NOTE — Telephone Encounter (Signed)
Resent order. Patient states pharmacy did not receive order that was sent on 02/16/23.

## 2023-02-23 NOTE — Telephone Encounter (Signed)
Patient called asked if the Rx for Diclofenac can be sent over to [facility]?  The number to contact patient is [phone] or [phone]

## 2023-03-03 DIAGNOSIS — N393 Stress incontinence (female) (male): Secondary | ICD-10-CM | POA: Diagnosis not present

## 2023-03-03 DIAGNOSIS — C672 Malignant neoplasm of lateral wall of bladder: Secondary | ICD-10-CM | POA: Diagnosis not present

## 2023-03-28 ENCOUNTER — Ambulatory Visit: Payer: Medicare Other | Admitting: Physician Assistant

## 2023-04-20 DIAGNOSIS — E559 Vitamin D deficiency, unspecified: Secondary | ICD-10-CM | POA: Diagnosis not present

## 2023-04-20 DIAGNOSIS — M8580 Other specified disorders of bone density and structure, unspecified site: Secondary | ICD-10-CM | POA: Diagnosis not present

## 2023-04-20 DIAGNOSIS — E785 Hyperlipidemia, unspecified: Secondary | ICD-10-CM | POA: Diagnosis not present

## 2023-04-20 DIAGNOSIS — R7301 Impaired fasting glucose: Secondary | ICD-10-CM | POA: Diagnosis not present

## 2023-04-20 DIAGNOSIS — I10 Essential (primary) hypertension: Secondary | ICD-10-CM | POA: Diagnosis not present

## 2023-04-27 DIAGNOSIS — I10 Essential (primary) hypertension: Secondary | ICD-10-CM | POA: Diagnosis not present

## 2023-04-27 DIAGNOSIS — R82998 Other abnormal findings in urine: Secondary | ICD-10-CM | POA: Diagnosis not present

## 2023-04-27 DIAGNOSIS — I771 Stricture of artery: Secondary | ICD-10-CM | POA: Diagnosis not present

## 2023-04-27 DIAGNOSIS — E785 Hyperlipidemia, unspecified: Secondary | ICD-10-CM | POA: Diagnosis not present

## 2023-04-27 DIAGNOSIS — Z8551 Personal history of malignant neoplasm of bladder: Secondary | ICD-10-CM | POA: Diagnosis not present

## 2023-04-27 DIAGNOSIS — M8580 Other specified disorders of bone density and structure, unspecified site: Secondary | ICD-10-CM | POA: Diagnosis not present

## 2023-04-27 DIAGNOSIS — R0989 Other specified symptoms and signs involving the circulatory and respiratory systems: Secondary | ICD-10-CM | POA: Diagnosis not present

## 2023-04-27 DIAGNOSIS — Z1339 Encounter for screening examination for other mental health and behavioral disorders: Secondary | ICD-10-CM | POA: Diagnosis not present

## 2023-04-27 DIAGNOSIS — E669 Obesity, unspecified: Secondary | ICD-10-CM | POA: Diagnosis not present

## 2023-04-27 DIAGNOSIS — Z23 Encounter for immunization: Secondary | ICD-10-CM | POA: Diagnosis not present

## 2023-04-27 DIAGNOSIS — Z0000 Encounter for general adult medical examination without abnormal findings: Secondary | ICD-10-CM | POA: Diagnosis not present

## 2023-04-27 DIAGNOSIS — Z1331 Encounter for screening for depression: Secondary | ICD-10-CM | POA: Diagnosis not present

## 2023-04-27 DIAGNOSIS — R7301 Impaired fasting glucose: Secondary | ICD-10-CM | POA: Diagnosis not present

## 2023-04-27 DIAGNOSIS — Z6835 Body mass index (BMI) 35.0-35.9, adult: Secondary | ICD-10-CM | POA: Diagnosis not present

## 2023-04-27 DIAGNOSIS — K219 Gastro-esophageal reflux disease without esophagitis: Secondary | ICD-10-CM | POA: Diagnosis not present

## 2023-04-27 DIAGNOSIS — M1380 Other specified arthritis, unspecified site: Secondary | ICD-10-CM | POA: Diagnosis not present

## 2023-04-27 DIAGNOSIS — M1711 Unilateral primary osteoarthritis, right knee: Secondary | ICD-10-CM | POA: Diagnosis not present

## 2023-04-27 DIAGNOSIS — H15009 Unspecified scleritis, unspecified eye: Secondary | ICD-10-CM | POA: Diagnosis not present

## 2023-05-05 ENCOUNTER — Ambulatory Visit (INDEPENDENT_AMBULATORY_CARE_PROVIDER_SITE_OTHER): Payer: Medicare Other | Admitting: Physician Assistant

## 2023-05-05 ENCOUNTER — Encounter: Payer: Self-pay | Admitting: Physician Assistant

## 2023-05-05 ENCOUNTER — Other Ambulatory Visit: Payer: Self-pay

## 2023-05-05 DIAGNOSIS — M1711 Unilateral primary osteoarthritis, right knee: Secondary | ICD-10-CM | POA: Diagnosis not present

## 2023-05-05 MED ORDER — METHYLPREDNISOLONE ACETATE 40 MG/ML IJ SUSP
40.0000 mg | INTRAMUSCULAR | Status: AC | PRN
  Administered 2023-05-05: 40 mg via INTRA_ARTICULAR

## 2023-05-05 MED ORDER — LIDOCAINE HCL 1 % IJ SOLN
3.0000 mL | INTRAMUSCULAR | Status: AC | PRN
  Administered 2023-05-05: 3 mL

## 2023-05-05 NOTE — Progress Notes (Signed)
 HPI: [name] comes in today due to right knee pain.  She states that the last injection with cortisone right knee given on 01/27/2023 gave her very little relief of any.  Most of her pain is lateral and posterior aspect of the knee.  She notes that she has sleep with a pillow between her knees.  Notes some swelling at the knee at times and giving way.  She has tried Voltaren gel which causes her to break out the rash but states that the generic diclofenac gel does not break her out.  She has had no injury to the knee.  Pain is worse with standing walking.  Describes pain as sharp at times but constantly ache.  She also takes Advil occasionally for the knee pain.  Review of systems: Denies any fevers chills or ongoing infection.  Physical exam: General Pleasant well-developed well-nourished female in no acute distress.  Affect appropriate.  Ambulates without any assistive device.  Bilateral knees: No abnormal warmth erythema or effusion.  No instability valgus varus stressing of either knee.  Right knee tenderness over the lateral joint line.  McMurray's is negative.  Radiographs:Right knee 2 views: Knee is well located.  No acute fractures or acute findings.  Moderately severe narrowing medial joint line.  Osteophytes off the lateral compartment.  Severe patellofemoral arthritic changes.  Impression: Right knee tricompartmental arthritis  Plan: Will try to obtain approval for viscosupplementation injection as she has tried cortisone injections without significant relief.  She is also tried exercise NSAIDs oral and topical.  Patient has no upcoming surgery planned on the right knee in the next 6 months.    Procedure Note  Patient: SHEENAH DIMITROFF             Date of Birth: Mar 30, 1950           MRN: [id number]             Visit Date: 05/05/2023  Procedures: Visit Diagnoses:  1. Primary osteoarthritis of right knee     Large Joint Inj: R knee on 05/05/2023 4:28 PM Indications:  pain Details: 22 G 1.5 in needle, anterolateral approach  Arthrogram: No  Medications: 3 mL lidocaine 1 %; 40 mg methylPREDNISolone acetate 40 MG/ML Outcome: tolerated well, no immediate complications Procedure, treatment alternatives, risks and benefits explained, specific risks discussed. Consent was given by the patient. Immediately prior to procedure a time out was called to verify the correct patient, procedure, equipment, support staff and site/side marked as required. Patient was prepped and draped in the usual sterile fashion.

## 2023-05-06 ENCOUNTER — Telehealth: Payer: Self-pay | Admitting: Radiology

## 2023-05-06 NOTE — Telephone Encounter (Signed)
 Rt knee visco approval

## 2023-05-23 ENCOUNTER — Telehealth: Payer: Self-pay | Admitting: Physician Assistant

## 2023-05-23 NOTE — Telephone Encounter (Signed)
 Patient called. Would like a gel injection.

## 2023-05-24 NOTE — Telephone Encounter (Signed)
 VOB submitted for Monovisc, right knee

## 2023-05-24 NOTE — Telephone Encounter (Signed)
Previously submitted  

## 2023-05-30 DIAGNOSIS — E785 Hyperlipidemia, unspecified: Secondary | ICD-10-CM | POA: Diagnosis not present

## 2023-05-30 DIAGNOSIS — I10 Essential (primary) hypertension: Secondary | ICD-10-CM | POA: Diagnosis not present

## 2023-05-30 DIAGNOSIS — M8580 Other specified disorders of bone density and structure, unspecified site: Secondary | ICD-10-CM | POA: Diagnosis not present

## 2023-05-30 DIAGNOSIS — M1380 Other specified arthritis, unspecified site: Secondary | ICD-10-CM | POA: Diagnosis not present

## 2023-06-07 ENCOUNTER — Other Ambulatory Visit (HOSPITAL_COMMUNITY): Payer: Self-pay | Admitting: *Deleted

## 2023-06-09 ENCOUNTER — Ambulatory Visit (HOSPITAL_COMMUNITY)
Admission: RE | Admit: 2023-06-09 | Discharge: 2023-06-09 | Disposition: A | Discharge status: Home / Self Care | Source: Ambulatory Visit | Attending: Internal Medicine | Admitting: Internal Medicine

## 2023-06-09 DIAGNOSIS — M810 Age-related osteoporosis without current pathological fracture: Secondary | ICD-10-CM | POA: Diagnosis not present

## 2023-06-09 MED ORDER — ZOLEDRONIC ACID 5 MG/100ML IV SOLN
5.0000 mg | Freq: Once | INTRAVENOUS | Status: AC
Start: 2023-06-09 — End: 2023-06-09

## 2023-06-09 MED ORDER — ZOLEDRONIC ACID 5 MG/100ML IV SOLN
INTRAVENOUS | Status: AC
  Filled 2023-06-09: qty 100

## 2023-06-09 MED ORDER — ZOLEDRONIC ACID 5 MG/100ML IV SOLN
INTRAVENOUS | Status: AC
  Administered 2023-06-09: 5 mg via INTRAVENOUS
  Filled 2023-06-09: qty 100

## 2023-09-21 DIAGNOSIS — H31003 Unspecified chorioretinal scars, bilateral: Secondary | ICD-10-CM | POA: Diagnosis not present

## 2023-09-21 DIAGNOSIS — H3589 Other specified retinal disorders: Secondary | ICD-10-CM | POA: Diagnosis not present

## 2023-09-21 DIAGNOSIS — H2513 Age-related nuclear cataract, bilateral: Secondary | ICD-10-CM | POA: Diagnosis not present

## 2023-09-21 DIAGNOSIS — H35033 Hypertensive retinopathy, bilateral: Secondary | ICD-10-CM | POA: Diagnosis not present

## 2023-11-09 DIAGNOSIS — L918 Other hypertrophic disorders of the skin: Secondary | ICD-10-CM | POA: Diagnosis not present

## 2023-11-09 DIAGNOSIS — L218 Other seborrheic dermatitis: Secondary | ICD-10-CM | POA: Diagnosis not present

## 2023-11-09 DIAGNOSIS — L308 Other specified dermatitis: Secondary | ICD-10-CM | POA: Diagnosis not present

## 2023-11-16 DIAGNOSIS — Z23 Encounter for immunization: Secondary | ICD-10-CM | POA: Diagnosis not present

## 2023-12-27 ENCOUNTER — Telehealth: Payer: Self-pay | Admitting: Radiology

## 2023-12-27 ENCOUNTER — Other Ambulatory Visit: Payer: Self-pay | Admitting: Physician Assistant

## 2023-12-27 MED ORDER — DICLOFENAC SODIUM 1 % EX GEL: 2.0000 g | Freq: Four times a day (QID) | CUTANEOUS | 1 refills | Status: AC

## 2023-12-27 NOTE — Telephone Encounter (Signed)
 Requests refill dicolfenac gel 1%

## 2024-01-16 ENCOUNTER — Encounter: Payer: Self-pay | Admitting: Radiology

## 2024-02-08 DIAGNOSIS — Z780 Asymptomatic menopausal state: Secondary | ICD-10-CM | POA: Diagnosis not present

## 2024-02-08 DIAGNOSIS — Z01411 Encounter for gynecological examination (general) (routine) with abnormal findings: Secondary | ICD-10-CM | POA: Diagnosis not present

## 2024-02-08 DIAGNOSIS — L900 Lichen sclerosus et atrophicus: Secondary | ICD-10-CM | POA: Diagnosis not present

## 2024-02-08 DIAGNOSIS — Z1231 Encounter for screening mammogram for malignant neoplasm of breast: Secondary | ICD-10-CM | POA: Diagnosis not present
